# Patient Record
Sex: Male | Born: 1935 | Race: White | Hispanic: No | Marital: Single | Smoking: Former smoker
Health system: Southern US, Community
[De-identification: ages and names within clinical notes are randomized; demographics above are authoritative.]

## PROBLEM LIST (undated history)

## (undated) DIAGNOSIS — N400 Enlarged prostate without lower urinary tract symptoms: Secondary | ICD-10-CM

## (undated) DIAGNOSIS — I10 Essential (primary) hypertension: Secondary | ICD-10-CM

## (undated) DIAGNOSIS — C61 Malignant neoplasm of prostate: Secondary | ICD-10-CM

## (undated) DIAGNOSIS — I4891 Unspecified atrial fibrillation: Secondary | ICD-10-CM

## (undated) DIAGNOSIS — E785 Hyperlipidemia, unspecified: Secondary | ICD-10-CM

## (undated) HISTORY — DX: Unspecified atrial fibrillation: I48.91

## (undated) HISTORY — PX: HERNIA REPAIR: SHX51

---

## 1997-06-28 ENCOUNTER — Ambulatory Visit (HOSPITAL_BASED_OUTPATIENT_CLINIC_OR_DEPARTMENT_OTHER): Admission: RE | Admit: 1997-06-28 | Discharge: 1997-06-28 | Payer: Self-pay | Admitting: *Deleted

## 2001-02-18 ENCOUNTER — Ambulatory Visit: Admission: RE | Admit: 2001-02-18 | Discharge: 2001-05-19 | Payer: Self-pay | Admitting: Radiation Oncology

## 2001-02-19 ENCOUNTER — Other Ambulatory Visit: Admission: RE | Admit: 2001-02-19 | Discharge: 2001-02-19 | Payer: Self-pay | Admitting: Radiation Oncology

## 2001-03-08 ENCOUNTER — Encounter: Payer: Self-pay | Admitting: Urology

## 2001-03-08 ENCOUNTER — Encounter: Admission: RE | Admit: 2001-03-08 | Discharge: 2001-03-08 | Payer: Self-pay | Admitting: Urology

## 2001-04-14 ENCOUNTER — Ambulatory Visit (HOSPITAL_BASED_OUTPATIENT_CLINIC_OR_DEPARTMENT_OTHER): Admission: RE | Admit: 2001-04-14 | Discharge: 2001-04-14 | Payer: Self-pay | Admitting: Urology

## 2001-06-21 ENCOUNTER — Ambulatory Visit: Admission: RE | Admit: 2001-06-21 | Discharge: 2001-09-19 | Payer: Self-pay | Admitting: Radiation Oncology

## 2008-10-30 ENCOUNTER — Encounter: Admission: RE | Admit: 2008-10-30 | Discharge: 2008-10-30 | Payer: Self-pay | Admitting: Obstetrics and Gynecology

## 2008-10-31 ENCOUNTER — Ambulatory Visit (HOSPITAL_BASED_OUTPATIENT_CLINIC_OR_DEPARTMENT_OTHER): Admission: RE | Admit: 2008-10-31 | Discharge: 2008-10-31 | Payer: Self-pay | Admitting: Surgery

## 2010-03-25 IMAGING — CR DG CHEST 2V
2 series · 2 of 2 positions shown · non-contrast
Comparison: None

CLINICAL DATA: Preop for surgery for repair of inguinal hernia

CHEST - 2 VIEW

[w chest pa]
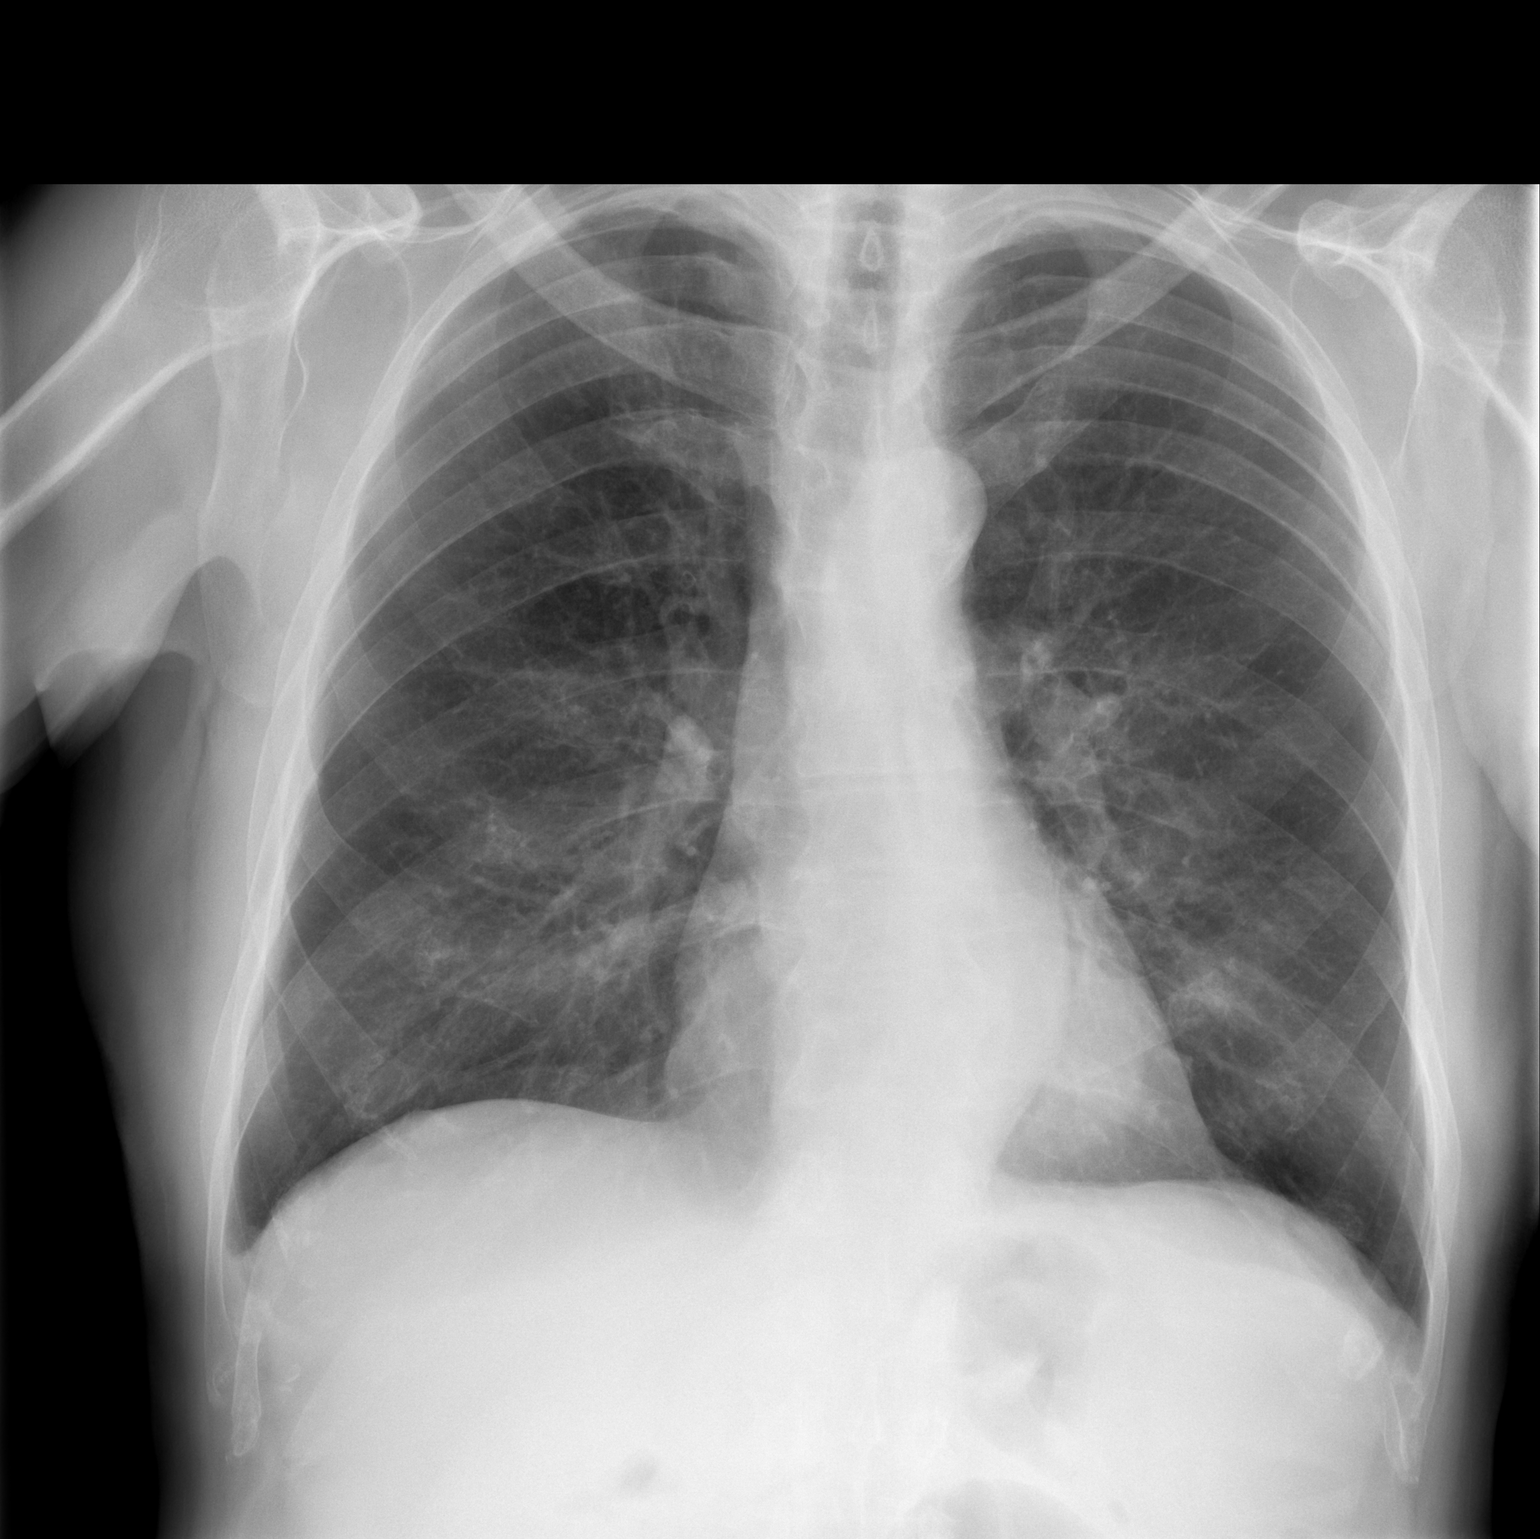

[w chest lat]
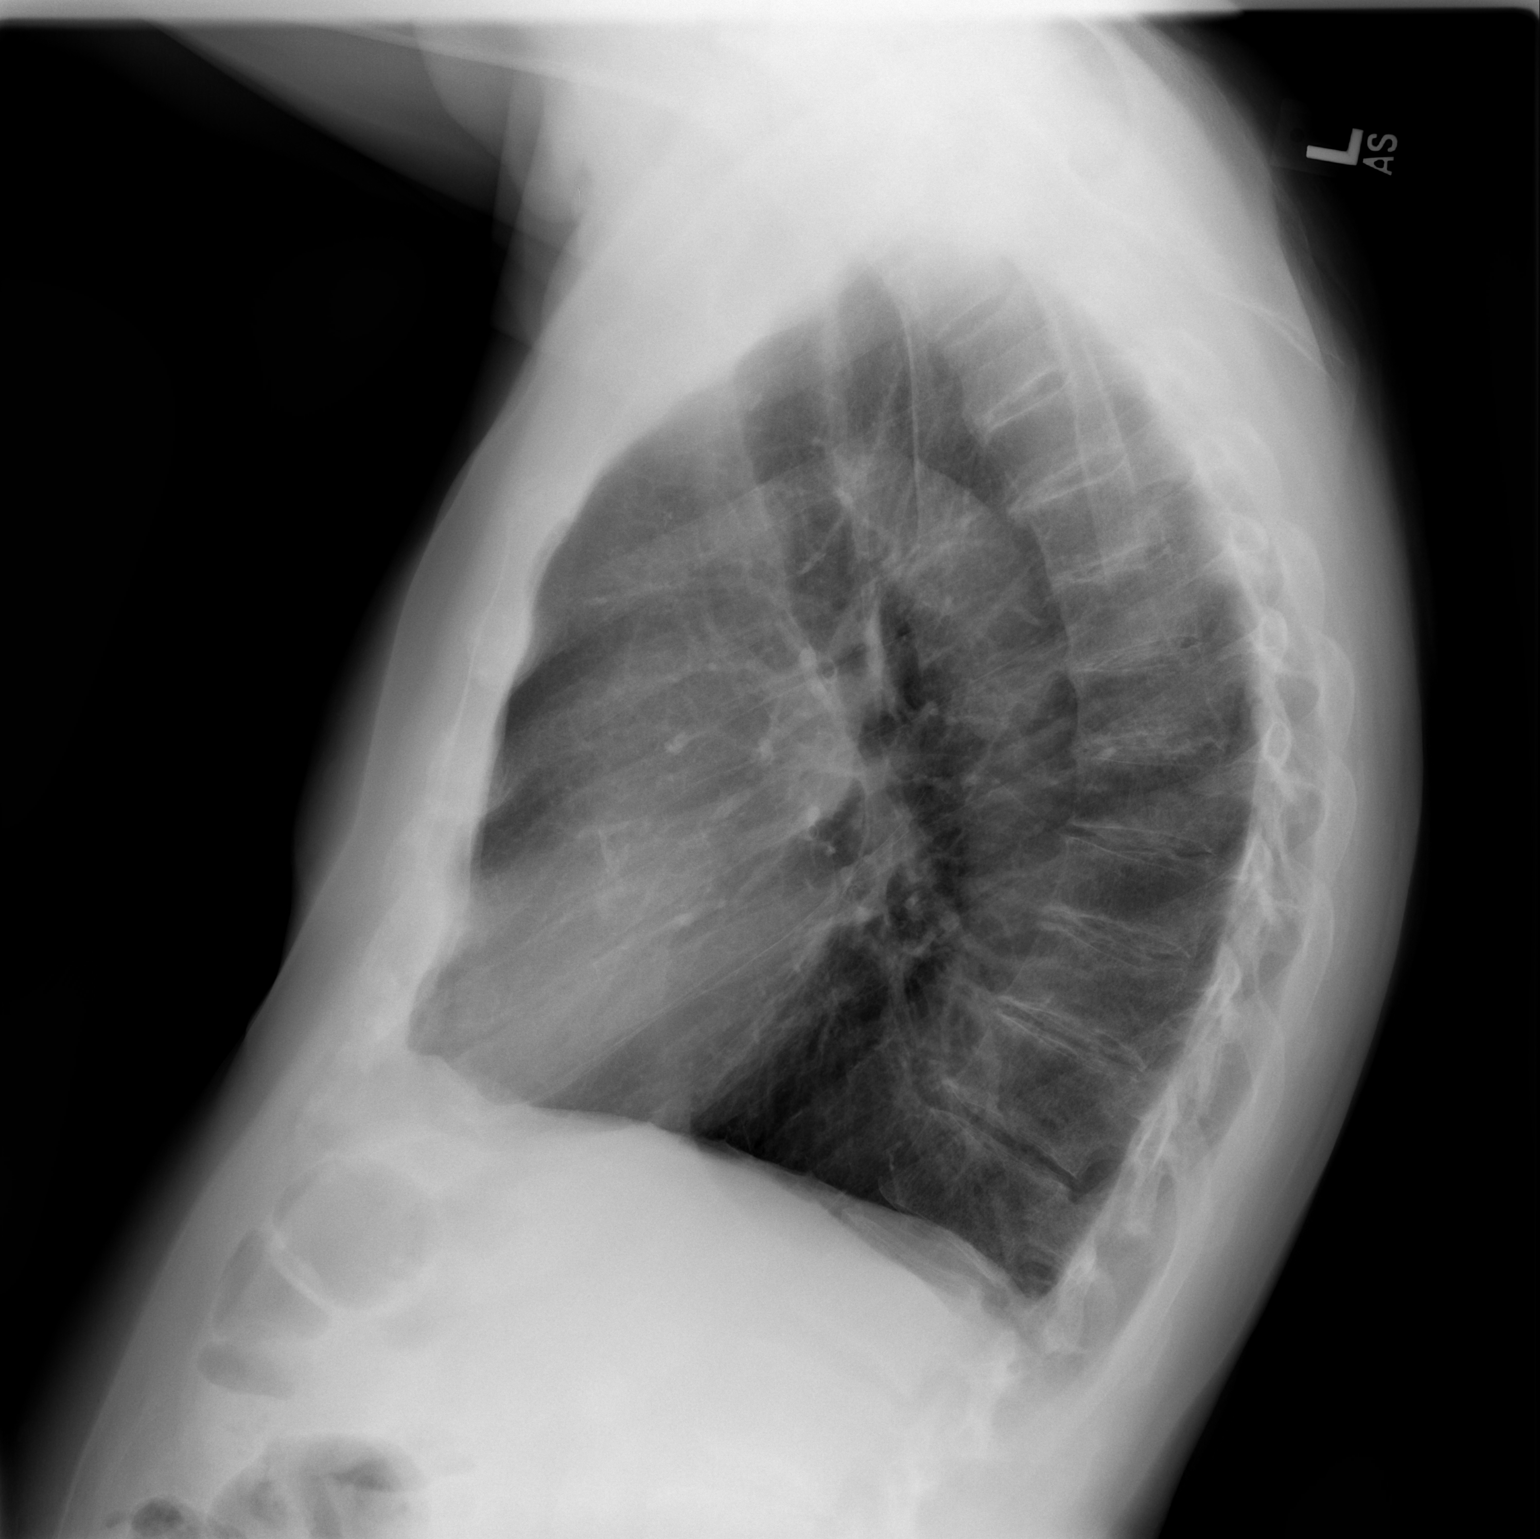

[2 of 2 positions shown; findings below may reference images not displayed]

FINDINGS: No active infiltrate or effusion is seen.  Mild
peribronchial thickening is noted.  The heart is within normal
limits in size.  No acute bony abnormality is seen.
IMPRESSION: No active lung disease.  Mild peribronchial thickening.

## 2010-05-24 LAB — BASIC METABOLIC PANEL
BUN: 7 mg/dL (ref 6–23)
CO2: 27 mEq/L (ref 19–32)
Calcium: 8.9 mg/dL (ref 8.4–10.5)
Chloride: 105 mEq/L (ref 96–112)
Creatinine, Ser: 0.85 mg/dL (ref 0.4–1.5)
GFR calc Af Amer: >60 mL/min (ref >60)
GFR calc non Af Amer: >60 mL/min (ref >60)
Glucose, Bld: 95 mg/dL (ref 70–99)
Potassium: 4.5 mEq/L (ref 3.5–5.1)
Sodium: 139 mEq/L (ref 135–145)

## 2010-05-24 LAB — POCT HEMOGLOBIN-HEMACUE: Hemoglobin: 16.2 g/dL (ref 13.0–17.0)

## 2010-07-05 NOTE — Op Note (Signed)
[facility]  Patient:    Gary Santiago, Gary Santiago Visit Number: [id number] MRN: [id number]          Service Type: NES Location: [facility] Attending Physician:  Thermon Leyland Dictated by:   Barron Alvine, M.D. Proc. Date: 04/14/01 Admit Date:  04/14/2001                             Operative Report  PREOPERATIVE DIAGNOSIS:  Clinical stage T1c adenocarcinoma of the prostate.  POSTOPERATIVE DIAGNOSIS:  Clinical stage T1c adenocarcinoma of the prostate.  PROCEDURE: 1. Implantation of Iodine 125 seeds. 2. Flexible cystoscopy.  SURGEON:  Barron Alvine, M.D.  ASSISTANT:  Maryln Gottron, M.D.  INDICATIONS:  Mr. Beigel is a 77 year old male. He has been diagnosed with clinical stage T1c adenocarcinoma of the prostate. He was diagnosed originally in the [place] area. He presented to [place] for a second opinion, initially requesting consideration for radical retropubic prostatectomy. We felt that given the situation, that may not be the best option. The patient did have a PSA that was elevated to approximately 16, although he had had approximately six biopsies done over the last several years, all of which have not shown carcinoma. He finally had one area on one biopsy that showed a small foci of carcinoma; that they felt that the Gleason score was 3+3=6. We felt that given his PSA of 16, it may not actually represent the true volume of his disease and his PSA elevation may be secondary to BPH and/or inflammation within his prostate. For that reason, along with his age, we felt that consideration for interstitial seed implantation as a monotherapy was a reasonable approach. We compared that with 3D conformal therapy, combined approaches with radiation as well as prostatectomy. Eventually, the patient did agree to seed implantation alone. Full informed consent was obtained.  TECHNIQUE AND FINDINGS:  The patient previously had planning  ultrasonography performed. He presented now for actual seed implantation. He was brought to the operating room where he had successful induction of general anesthesia. He was placed in the moderate lithotomy position and prepped and draped in the usual manner. A Foley catheter with contrast in the balloon was placed and a rectal tube was also placed. Real-time transrectal ultrasonography was used as was fluoroscopy. We took great care in positioning the patient and setting the prostate in the exact same position it was for the planning ultrasound. After we were satisfied with all of that, the patient had anchoring needles placed. We determined the reference plane for the seed implantation and again used the real-time ultrasonography as well as fluoroscopy. The seeds were then placed through 20 needles and a total of 94 seeds were implanted. These were all on strands. The patient had no obvious problems. Each one of these needle passes were done with both imaging modalities to confirm good positioning. At the end of the procedure, the distribution of the seeds on the ultrasound and fluoroscopy were excellent. There were no obvious stray seeds or strands. The Foley catheter was removed and cystoscopy revealed no evidence of seeds within the prostatic fossa or bladder. A repeat Foley catheter was placed and the patient brought to recovery room in stable condition. Dictated by:   Barron Alvine, M.D. Attending Physician:  Thermon Leyland DD:  04/14/01 TD:  04/14/01 Job: [id number]

## 2014-11-07 DIAGNOSIS — E780 Pure hypercholesterolemia: Secondary | ICD-10-CM | POA: Diagnosis not present

## 2014-11-07 DIAGNOSIS — M810 Age-related osteoporosis without current pathological fracture: Secondary | ICD-10-CM | POA: Diagnosis not present

## 2014-11-07 DIAGNOSIS — Z0000 Encounter for general adult medical examination without abnormal findings: Secondary | ICD-10-CM | POA: Diagnosis not present

## 2014-11-07 DIAGNOSIS — I10 Essential (primary) hypertension: Secondary | ICD-10-CM | POA: Diagnosis not present

## 2014-11-20 DIAGNOSIS — Z8546 Personal history of malignant neoplasm of prostate: Secondary | ICD-10-CM | POA: Diagnosis not present

## 2015-01-18 DIAGNOSIS — Z23 Encounter for immunization: Secondary | ICD-10-CM | POA: Diagnosis not present

## 2015-01-29 ENCOUNTER — Ambulatory Visit (INDEPENDENT_AMBULATORY_CARE_PROVIDER_SITE_OTHER): Payer: Medicare Other | Admitting: Family Medicine

## 2015-01-29 DIAGNOSIS — L723 Sebaceous cyst: Secondary | ICD-10-CM | POA: Diagnosis present

## 2015-01-29 NOTE — Progress Notes (Signed)
   HPI  CC: Right groin abscess Patient is here with complaints of a right groin abscess. He states that this growth was first recognized approximately 2 months ago while he was in the shower. Since that time and has grown substantially and has become tender to the touch. He noted earlier this week that it had begun draining some purulent fluid. He denies any history of hernias, abdominal pain, abdominal cramping, nausea, vomiting, diarrhea, lightheadedness, diaphoresis, testicular pain, or increased pain with coughing.   Patient is from [place]. He is currently visiting his sister and will be in town over the next week or 2.  Review of Systems   See HPI for ROS. All other systems reviewed and are negative.  Objective: There were no vitals taken for this visit. Gen: NAD, alert, cooperative, and pleasant. Well-appearing, elderly. Abd: SNTND, BS present, no guarding or organomegaly Integument: 3 x 3 cm erythematous nodule noted at the right inguinal fold. No evidence of tracking. Purulent discharge appreciated. Central ulceration noted. Some fluctuance noted. Surrounding induration. No tenderness or erythema extending beyond the site of this lesion. Ext: No edema, warm Neuro: Alert and oriented, Speech clear, No gross deficits   INCISION AND DRAINAGE OF right inguinal sebaceous cyst  A timeout protocol was performed prior to initiating the procedure. The area was prepared and draped in the usual, sterile manner. The site was anesthetized with 9 ml of 1 % lidocaine with epinephrine. A linear stab incision along the local skin lines was made and firm white malodorous material was expressed. The abcess was probed with hemostat to assess for additional pockets. Bleeding was minimal. 10 cm of packing was placed.   The patient tolerated the procedure well without complications.  Standard post-procedure care is explained and return precautions are given.   Assessment and plan:  Sebaceous  cyst Right inguinal sebaceous cyst was I&D'ed in the clinic today. Patient tolerated this well. Blood loss was minimal (<20ml). Patient was provided additional gauze. He was instructed to remove the packing in approximately 48-60 hours. He was asked to keep this area dry for at least 48 hours. A follow-up appointment was provided for Friday of this week. - Remove packing in 48-60 hours. - Keep dry and clean as best as possible. - No antibiotics warranted at this time. - We thoroughly discussed signs and symptoms of infection and indications to be seen in our office or urgent care sooner. - Follow-up on 12/16 at 3:15 PM for a wound check.    Elberta Leatherwood, MD,MS,  PGY2 01/29/2015 7:23 PM

## 2015-01-29 NOTE — Assessment & Plan Note (Signed)
Right inguinal sebaceous cyst was I&D'ed in the clinic today. Patient tolerated this well. Blood loss was minimal (<27ml). Patient was provided additional gauze. He was instructed to remove the packing in approximately 48-60 hours. He was asked to keep this area dry for at least 48 hours. A follow-up appointment was provided for Friday of this week. - Remove packing in 48-60 hours. - Keep dry and clean as best as possible. - No antibiotics warranted at this time. - We thoroughly discussed signs and symptoms of infection and indications to be seen in our office or urgent care sooner. - Follow-up on 12/16 at 3:15 PM for a wound check.

## 2015-01-29 NOTE — Patient Instructions (Signed)
Sebaceous Cyst Removal Sebaceous cyst removal is a procedure to remove a sac of oily material that forms under your skin (sebaceous cyst). Sebaceous cysts may also be called epidermoid cysts or keratin cysts. Normally, the skin secretes this oily material through a gland or a hair follicle. This type of cyst usually results when a skin gland or hair follicle becomes blocked. You may need this procedure if you have a sebaceous cyst that becomes large, uncomfortable, or infected. LET YOUR HEALTH CARE PROVIDER KNOW ABOUT:  Any allergies you have.  All medicines you are taking, including vitamins, herbs, eye drops, creams, and over-the-counter medicines.  Previous problems you or members of your family have had with the use of anesthetics.  Any blood disorders you have.  Previous surgeries you have had.  Medical conditions you have. RISKS AND COMPLICATIONS Generally, this is a safe procedure. However, problems may occur, including:  Developing another cyst.  Bleeding.  Infection.  Scarring. BEFORE THE PROCEDURE  Ask your health care provider about:  Changing or stopping your regular medicines. This is especially important if you are taking diabetes medicines or blood thinners.  Taking medicines such as aspirin and ibuprofen. These medicines can thin your blood. Do not take these medicines before your procedure if your health care provider instructs you not to.  If you have an infected cyst, you may have to take antibiotic medicines before or after the cyst removal. Take your antibiotics as directed by your health care provider. Finish all of the medicine even if you start to feel better.  Take a shower on the morning of your procedure. Your health care provider may ask you to use a germ-killing (antiseptic) soap. PROCEDURE  You will be given a medicine that numbs the area (local anesthetic).  The skin around the cyst will be cleaned with a germ-killing solution  (antiseptic).  Your health care provider will make a small surgical incision over the cyst.  The cyst will be separated from the surrounding tissues that are under your skin.  If possible, the cyst will be removed undamaged (intact).  If the cyst bursts (ruptures), it will need to be removed in pieces.  After the cyst is removed, your health care provider will control any bleeding and close the incision with small stitches (sutures). Small incisions may not need sutures, and the bleeding will be controlled by applying direct pressure with gauze.  Your health care provider may apply antibiotic ointment and a light bandage (dressing) over the incision. This procedure may vary among health care providers and hospitals. AFTER THE PROCEDURE  If your cyst ruptured during surgery, you may need to take antibiotic medicine. If you were prescribed an antibiotic medicine, finish all of it even if you start to feel better.   This information is not intended to replace advice given to you by your health care provider. Make sure you discuss any questions you have with your health care provider.   Document Released: 02/01/2000 Document Revised: 02/24/2014 Document Reviewed: 10/19/2013 Elsevier Interactive Patient Education 2016 Elsevier Inc.  

## 2015-02-02 ENCOUNTER — Ambulatory Visit (INDEPENDENT_AMBULATORY_CARE_PROVIDER_SITE_OTHER): Payer: Medicare Other | Admitting: Family Medicine

## 2015-02-02 VITALS — BP 135/54 | HR 58 | Temp 98.0°F | Wt 172.7 lb

## 2015-02-02 DIAGNOSIS — L0591 Pilonidal cyst without abscess: Secondary | ICD-10-CM | POA: Insufficient documentation

## 2015-02-02 DIAGNOSIS — L723 Sebaceous cyst: Secondary | ICD-10-CM

## 2015-02-02 NOTE — Patient Instructions (Signed)
I am referring you to a dermatology for your cyst on your backside. You will get a phone call to schedule this appointment.   For the spot in your groin: -keep it gently covered -warm compresses will help it keep draining -follow up in about 1 week for recheck -return sooner if worsening redness, drainage, fevers, pain etc  Be well, Dr. Ardelia Mems

## 2015-02-02 NOTE — Assessment & Plan Note (Signed)
Separate cyst from the one that was I&D'd by Dr. Alease Frame. Patient requesting referral for evaluation & removal of this cyst. Will refer to dermatology.

## 2015-02-02 NOTE — Assessment & Plan Note (Signed)
Packing removed today. Some redness around the area that seems superficial from skin irritation. Scrotum normal in size.  Recommend gently covering with gauze and follow up for check in 1 week, sooner if worsening. Given supply of gauze. Follow up 1 wk.

## 2015-02-02 NOTE — Progress Notes (Signed)
Date of Visit: 02/02/2015   HPI:  Groin cyst: seen on 12/12 for sebaceous cyst of R groin area that had grown in size. Underwent office I&D that day. Packed with 10cm of packing. Patient has not removed packing. The area feels fine, other than a little irritated from packing sticking out. Eating and drinking well. Stooling and urinating normally. No fevers.  Tailbone cyst: reports having cyst at top of gluteal cleft for many years that he would like removed. Bothers him when he sits for long periods. A few years ago he pulled a big clump of hair out of the area. Wants referral to have this done.   ROS: See HPI.  Gary Santiago: history of hyperlipidemia and atrial fibrillation  PHYSICAL EXAM: BP 135/54 mmHg  Pulse 58  Temp(Src) 98 F (36.7 C) (Oral)  Wt 172 lb 11.2 oz (78.336 kg) Gen: NAD, well appearing male, appears younger than stated age Skin: small incision site on R groin intertriginous area with packing still in place. Packing removed. Wound relatively superficial, with some surrounding induration but no fluctuance or tenderness. No expressible drainage. There is an area of superficial erythema appx 2cm away from the incision site which appears to be skin irritation rather than cellulitis/infection. Back: small palpable nodularity at top of gluteal cleft, peasized. No drainage. No hair tufts.  ASSESSMENT/PLAN:  Sebaceous cyst Packing removed today. Some redness around the area that seems superficial from skin irritation. Scrotum normal in size.  Recommend gently covering with gauze and follow up for check in 1 week, sooner if worsening. Given supply of gauze. Follow up 1 wk.   Cyst near tailbone Separate cyst from the one that was I&D'd by Dr. Alease Santiago. Patient requesting referral for evaluation & removal of this cyst. Will refer to dermatology.   FOLLOW UP: F/u in 1 week for groin cyst Referring to dermatology for tailbone cyst  Gary Santiago, [place]

## 2015-02-08 ENCOUNTER — Ambulatory Visit (INDEPENDENT_AMBULATORY_CARE_PROVIDER_SITE_OTHER): Payer: Medicare Other | Admitting: Family Medicine

## 2015-02-08 ENCOUNTER — Encounter: Payer: Self-pay | Admitting: Family Medicine

## 2015-02-08 VITALS — BP 133/59 | HR 65 | Temp 98.0°F | Ht 71.0 in | Wt 174.6 lb

## 2015-02-08 DIAGNOSIS — L723 Sebaceous cyst: Secondary | ICD-10-CM | POA: Diagnosis present

## 2015-02-08 DIAGNOSIS — I4891 Unspecified atrial fibrillation: Secondary | ICD-10-CM | POA: Insufficient documentation

## 2015-02-08 DIAGNOSIS — I482 Chronic atrial fibrillation, unspecified: Secondary | ICD-10-CM

## 2015-02-08 NOTE — Assessment & Plan Note (Signed)
Healing well and has stopped draining. Follow up as needed.

## 2015-02-08 NOTE — Patient Instructions (Signed)
Follow up as needed Be well, Dr. Ardelia Mems

## 2015-02-08 NOTE — Assessment & Plan Note (Signed)
Encouraged him to establish with new cardiologist once he has finished moving.

## 2015-02-08 NOTE — Progress Notes (Signed)
Date of Visit: 02/08/2015   HPI:  Patient presents for wound check of R inguinal sebaceous cyst. Had it removed/I&D'd on 12/12 by Dr. Alease Frame. Since I saw him on 12/16 it has done well. Drained every day except for yesterday, which is when it stopped draining. No pain. Urinating well. Eating and drinking well. No fevers.   Patient is in process of moving to [place] and plans to establish with a new PCP and cardiologist there. He has a history of atrial fibrillation that he reports has been well controlled for the last 25 years on diltiazem and baby aspirin.  ROS: See HPI.  [place]: history of atrial fibrillation, hyperlipidemia   PHYSICAL EXAM: BP 133/59 mmHg  Pulse 65  Temp(Src) 98 F (36.7 C) (Oral)  Ht 5\' 11"  (1.803 m)  Wt 174 lb 9.6 oz (79.198 kg)  BMI 24.36 kg/m2 Gen: NAD, pleasant cooperative HEENT: NCAT Lungs: normal work of breathing  Neuro: alert, gait and speech normal Skin: R inguinal area of prior I&D with mild induration and no fluctuance. No skin breakdown or drainage. nontender to palpation. Healing well.   ASSESSMENT/PLAN:  Atrial fibrillation [facility]) Encouraged him to establish with new cardiologist once he has finished moving.  Sebaceous cyst Healing well and has stopped draining. Follow up as needed.   FOLLOW UP: F/u as needed if symptoms worsen or do not improve.   [place]. Ardelia Mems, [place]

## 2015-03-16 DIAGNOSIS — L728 Other follicular cysts of the skin and subcutaneous tissue: Secondary | ICD-10-CM | POA: Diagnosis not present

## 2015-06-21 DIAGNOSIS — R300 Dysuria: Secondary | ICD-10-CM | POA: Diagnosis not present

## 2015-06-28 DIAGNOSIS — R300 Dysuria: Secondary | ICD-10-CM | POA: Diagnosis not present

## 2015-06-28 DIAGNOSIS — Z0000 Encounter for general adult medical examination without abnormal findings: Secondary | ICD-10-CM | POA: Diagnosis not present

## 2015-07-04 DIAGNOSIS — R300 Dysuria: Secondary | ICD-10-CM | POA: Diagnosis not present

## 2015-07-04 DIAGNOSIS — Z0000 Encounter for general adult medical examination without abnormal findings: Secondary | ICD-10-CM | POA: Diagnosis not present

## 2015-07-20 DIAGNOSIS — E785 Hyperlipidemia, unspecified: Secondary | ICD-10-CM | POA: Diagnosis not present

## 2015-07-20 DIAGNOSIS — R002 Palpitations: Secondary | ICD-10-CM | POA: Diagnosis not present

## 2015-07-20 DIAGNOSIS — M810 Age-related osteoporosis without current pathological fracture: Secondary | ICD-10-CM | POA: Diagnosis not present

## 2015-07-20 DIAGNOSIS — I10 Essential (primary) hypertension: Secondary | ICD-10-CM | POA: Diagnosis not present

## 2015-10-02 DIAGNOSIS — N390 Urinary tract infection, site not specified: Secondary | ICD-10-CM | POA: Diagnosis not present

## 2015-10-02 DIAGNOSIS — R300 Dysuria: Secondary | ICD-10-CM | POA: Diagnosis not present

## 2015-10-02 DIAGNOSIS — Z7982 Long term (current) use of aspirin: Secondary | ICD-10-CM | POA: Diagnosis not present

## 2015-10-02 DIAGNOSIS — I4891 Unspecified atrial fibrillation: Secondary | ICD-10-CM | POA: Diagnosis not present

## 2015-10-29 DIAGNOSIS — N390 Urinary tract infection, site not specified: Secondary | ICD-10-CM | POA: Diagnosis not present

## 2015-10-29 DIAGNOSIS — Z7982 Long term (current) use of aspirin: Secondary | ICD-10-CM | POA: Diagnosis not present

## 2015-11-08 DIAGNOSIS — R3121 Asymptomatic microscopic hematuria: Secondary | ICD-10-CM | POA: Diagnosis not present

## 2015-11-08 DIAGNOSIS — N3020 Other chronic cystitis without hematuria: Secondary | ICD-10-CM | POA: Diagnosis not present

## 2015-11-09 DIAGNOSIS — R319 Hematuria, unspecified: Secondary | ICD-10-CM | POA: Diagnosis not present

## 2015-11-22 DIAGNOSIS — Z23 Encounter for immunization: Secondary | ICD-10-CM | POA: Diagnosis not present

## 2015-12-04 DIAGNOSIS — Z7982 Long term (current) use of aspirin: Secondary | ICD-10-CM | POA: Diagnosis not present

## 2015-12-04 DIAGNOSIS — N390 Urinary tract infection, site not specified: Secondary | ICD-10-CM | POA: Diagnosis not present

## 2015-12-10 DIAGNOSIS — R300 Dysuria: Secondary | ICD-10-CM | POA: Diagnosis not present

## 2015-12-10 DIAGNOSIS — Z8546 Personal history of malignant neoplasm of prostate: Secondary | ICD-10-CM | POA: Diagnosis not present

## 2015-12-10 DIAGNOSIS — R351 Nocturia: Secondary | ICD-10-CM | POA: Diagnosis not present

## 2015-12-10 DIAGNOSIS — N3020 Other chronic cystitis without hematuria: Secondary | ICD-10-CM | POA: Diagnosis not present

## 2015-12-10 DIAGNOSIS — R3915 Urgency of urination: Secondary | ICD-10-CM | POA: Diagnosis not present

## 2016-01-01 DIAGNOSIS — E7800 Pure hypercholesterolemia, unspecified: Secondary | ICD-10-CM | POA: Diagnosis not present

## 2016-01-01 DIAGNOSIS — N390 Urinary tract infection, site not specified: Secondary | ICD-10-CM | POA: Diagnosis not present

## 2016-01-01 DIAGNOSIS — Z87440 Personal history of urinary (tract) infections: Secondary | ICD-10-CM | POA: Diagnosis not present

## 2016-01-01 DIAGNOSIS — I4891 Unspecified atrial fibrillation: Secondary | ICD-10-CM | POA: Diagnosis not present

## 2016-01-01 DIAGNOSIS — Z7982 Long term (current) use of aspirin: Secondary | ICD-10-CM | POA: Diagnosis not present

## 2016-01-17 DIAGNOSIS — R350 Frequency of micturition: Secondary | ICD-10-CM | POA: Diagnosis not present

## 2016-01-17 DIAGNOSIS — R300 Dysuria: Secondary | ICD-10-CM | POA: Diagnosis not present

## 2016-01-17 DIAGNOSIS — N390 Urinary tract infection, site not specified: Secondary | ICD-10-CM | POA: Diagnosis not present

## 2016-03-03 DIAGNOSIS — C61 Malignant neoplasm of prostate: Secondary | ICD-10-CM | POA: Diagnosis not present

## 2016-03-03 DIAGNOSIS — R300 Dysuria: Secondary | ICD-10-CM | POA: Diagnosis not present

## 2016-03-27 DIAGNOSIS — Z719 Counseling, unspecified: Secondary | ICD-10-CM | POA: Diagnosis not present

## 2016-03-27 DIAGNOSIS — Z139 Encounter for screening, unspecified: Secondary | ICD-10-CM | POA: Diagnosis not present

## 2016-03-27 DIAGNOSIS — J40 Bronchitis, not specified as acute or chronic: Secondary | ICD-10-CM | POA: Diagnosis not present

## 2016-04-04 ENCOUNTER — Telehealth: Payer: Self-pay | Admitting: Family Medicine

## 2016-04-04 NOTE — Telephone Encounter (Signed)
Chart review mentions that patient planned on moving but no further follow-up had been mad to establish this. The patient has confirmed that he has settled in [place] and is no longer an [facility] patient.

## 2016-04-08 DIAGNOSIS — E785 Hyperlipidemia, unspecified: Secondary | ICD-10-CM | POA: Diagnosis not present

## 2016-04-08 DIAGNOSIS — N400 Enlarged prostate without lower urinary tract symptoms: Secondary | ICD-10-CM | POA: Diagnosis not present

## 2016-04-08 DIAGNOSIS — R7301 Impaired fasting glucose: Secondary | ICD-10-CM | POA: Diagnosis not present

## 2016-04-08 DIAGNOSIS — Z136 Encounter for screening for cardiovascular disorders: Secondary | ICD-10-CM | POA: Diagnosis not present

## 2016-04-08 DIAGNOSIS — I482 Chronic atrial fibrillation: Secondary | ICD-10-CM | POA: Diagnosis not present

## 2016-04-08 DIAGNOSIS — I10 Essential (primary) hypertension: Secondary | ICD-10-CM | POA: Diagnosis not present

## 2016-04-08 DIAGNOSIS — E559 Vitamin D deficiency, unspecified: Secondary | ICD-10-CM | POA: Diagnosis not present

## 2016-04-08 DIAGNOSIS — Z719 Counseling, unspecified: Secondary | ICD-10-CM | POA: Diagnosis not present

## 2016-04-08 DIAGNOSIS — Z1211 Encounter for screening for malignant neoplasm of colon: Secondary | ICD-10-CM | POA: Diagnosis not present

## 2016-06-21 DIAGNOSIS — Z719 Counseling, unspecified: Secondary | ICD-10-CM | POA: Diagnosis not present

## 2016-06-21 DIAGNOSIS — W57XXXA Bitten or stung by nonvenomous insect and other nonvenomous arthropods, initial encounter: Secondary | ICD-10-CM | POA: Diagnosis not present

## 2016-12-18 DIAGNOSIS — E785 Hyperlipidemia, unspecified: Secondary | ICD-10-CM | POA: Diagnosis not present

## 2016-12-18 DIAGNOSIS — I482 Chronic atrial fibrillation: Secondary | ICD-10-CM | POA: Diagnosis not present

## 2016-12-18 DIAGNOSIS — I10 Essential (primary) hypertension: Secondary | ICD-10-CM | POA: Diagnosis not present

## 2016-12-18 DIAGNOSIS — E559 Vitamin D deficiency, unspecified: Secondary | ICD-10-CM | POA: Diagnosis not present

## 2016-12-23 DIAGNOSIS — I482 Chronic atrial fibrillation: Secondary | ICD-10-CM | POA: Diagnosis not present

## 2017-01-13 DIAGNOSIS — I4891 Unspecified atrial fibrillation: Secondary | ICD-10-CM | POA: Diagnosis not present

## 2017-02-06 DIAGNOSIS — Z23 Encounter for immunization: Secondary | ICD-10-CM | POA: Diagnosis not present

## 2017-04-30 DIAGNOSIS — L853 Xerosis cutis: Secondary | ICD-10-CM | POA: Diagnosis not present

## 2017-04-30 DIAGNOSIS — L570 Actinic keratosis: Secondary | ICD-10-CM | POA: Diagnosis not present

## 2017-04-30 DIAGNOSIS — L821 Other seborrheic keratosis: Secondary | ICD-10-CM | POA: Diagnosis not present

## 2017-04-30 DIAGNOSIS — L818 Other specified disorders of pigmentation: Secondary | ICD-10-CM | POA: Diagnosis not present

## 2017-04-30 DIAGNOSIS — C44329 Squamous cell carcinoma of skin of other parts of face: Secondary | ICD-10-CM | POA: Diagnosis not present

## 2017-04-30 DIAGNOSIS — D2339 Other benign neoplasm of skin of other parts of face: Secondary | ICD-10-CM | POA: Diagnosis not present

## 2017-04-30 DIAGNOSIS — L578 Other skin changes due to chronic exposure to nonionizing radiation: Secondary | ICD-10-CM | POA: Diagnosis not present

## 2017-04-30 DIAGNOSIS — D485 Neoplasm of uncertain behavior of skin: Secondary | ICD-10-CM | POA: Diagnosis not present

## 2017-05-08 DIAGNOSIS — R9721 Rising PSA following treatment for malignant neoplasm of prostate: Secondary | ICD-10-CM | POA: Diagnosis not present

## 2017-05-08 DIAGNOSIS — R300 Dysuria: Secondary | ICD-10-CM | POA: Diagnosis not present

## 2017-05-08 DIAGNOSIS — C61 Malignant neoplasm of prostate: Secondary | ICD-10-CM | POA: Diagnosis not present

## 2017-05-28 DIAGNOSIS — D1801 Hemangioma of skin and subcutaneous tissue: Secondary | ICD-10-CM | POA: Diagnosis not present

## 2017-05-28 DIAGNOSIS — D0460 Carcinoma in situ of skin of unspecified upper limb, including shoulder: Secondary | ICD-10-CM | POA: Diagnosis not present

## 2017-05-28 DIAGNOSIS — C44329 Squamous cell carcinoma of skin of other parts of face: Secondary | ICD-10-CM | POA: Diagnosis not present

## 2017-05-28 DIAGNOSIS — L570 Actinic keratosis: Secondary | ICD-10-CM | POA: Diagnosis not present

## 2017-05-28 DIAGNOSIS — L818 Other specified disorders of pigmentation: Secondary | ICD-10-CM | POA: Diagnosis not present

## 2017-05-28 DIAGNOSIS — D485 Neoplasm of uncertain behavior of skin: Secondary | ICD-10-CM | POA: Diagnosis not present

## 2017-05-28 DIAGNOSIS — L578 Other skin changes due to chronic exposure to nonionizing radiation: Secondary | ICD-10-CM | POA: Diagnosis not present

## 2017-05-28 DIAGNOSIS — L821 Other seborrheic keratosis: Secondary | ICD-10-CM | POA: Diagnosis not present

## 2017-05-28 DIAGNOSIS — Z1283 Encounter for screening for malignant neoplasm of skin: Secondary | ICD-10-CM | POA: Diagnosis not present

## 2017-06-08 DIAGNOSIS — C44329 Squamous cell carcinoma of skin of other parts of face: Secondary | ICD-10-CM | POA: Diagnosis not present

## 2017-07-07 DIAGNOSIS — D0461 Carcinoma in situ of skin of right upper limb, including shoulder: Secondary | ICD-10-CM | POA: Diagnosis not present

## 2017-07-07 DIAGNOSIS — L570 Actinic keratosis: Secondary | ICD-10-CM | POA: Diagnosis not present

## 2017-07-07 DIAGNOSIS — D485 Neoplasm of uncertain behavior of skin: Secondary | ICD-10-CM | POA: Diagnosis not present

## 2017-08-31 DIAGNOSIS — L308 Other specified dermatitis: Secondary | ICD-10-CM | POA: Diagnosis not present

## 2017-08-31 DIAGNOSIS — D0461 Carcinoma in situ of skin of right upper limb, including shoulder: Secondary | ICD-10-CM | POA: Diagnosis not present

## 2017-08-31 DIAGNOSIS — L570 Actinic keratosis: Secondary | ICD-10-CM | POA: Diagnosis not present

## 2017-08-31 DIAGNOSIS — L298 Other pruritus: Secondary | ICD-10-CM | POA: Diagnosis not present

## 2017-08-31 DIAGNOSIS — Z85828 Personal history of other malignant neoplasm of skin: Secondary | ICD-10-CM | POA: Diagnosis not present

## 2017-09-24 DIAGNOSIS — T148XXD Other injury of unspecified body region, subsequent encounter: Secondary | ICD-10-CM | POA: Diagnosis not present

## 2017-09-24 DIAGNOSIS — N400 Enlarged prostate without lower urinary tract symptoms: Secondary | ICD-10-CM | POA: Diagnosis not present

## 2017-09-24 DIAGNOSIS — I10 Essential (primary) hypertension: Secondary | ICD-10-CM | POA: Diagnosis not present

## 2017-09-24 DIAGNOSIS — R600 Localized edema: Secondary | ICD-10-CM | POA: Diagnosis not present

## 2017-09-24 DIAGNOSIS — I482 Chronic atrial fibrillation: Secondary | ICD-10-CM | POA: Diagnosis not present

## 2017-11-02 DIAGNOSIS — Z08 Encounter for follow-up examination after completed treatment for malignant neoplasm: Secondary | ICD-10-CM | POA: Diagnosis not present

## 2017-11-02 DIAGNOSIS — C44622 Squamous cell carcinoma of skin of right upper limb, including shoulder: Secondary | ICD-10-CM | POA: Diagnosis not present

## 2017-11-02 DIAGNOSIS — L853 Xerosis cutis: Secondary | ICD-10-CM | POA: Diagnosis not present

## 2017-11-02 DIAGNOSIS — L570 Actinic keratosis: Secondary | ICD-10-CM | POA: Diagnosis not present

## 2017-11-02 DIAGNOSIS — Z85828 Personal history of other malignant neoplasm of skin: Secondary | ICD-10-CM | POA: Diagnosis not present

## 2017-12-15 DIAGNOSIS — C44622 Squamous cell carcinoma of skin of right upper limb, including shoulder: Secondary | ICD-10-CM | POA: Diagnosis not present

## 2017-12-15 DIAGNOSIS — L570 Actinic keratosis: Secondary | ICD-10-CM | POA: Diagnosis not present

## 2017-12-30 DIAGNOSIS — L570 Actinic keratosis: Secondary | ICD-10-CM | POA: Diagnosis not present

## 2018-01-05 DIAGNOSIS — Z23 Encounter for immunization: Secondary | ICD-10-CM | POA: Diagnosis not present

## 2018-01-05 DIAGNOSIS — E785 Hyperlipidemia, unspecified: Secondary | ICD-10-CM | POA: Diagnosis not present

## 2018-01-05 DIAGNOSIS — N400 Enlarged prostate without lower urinary tract symptoms: Secondary | ICD-10-CM | POA: Diagnosis not present

## 2018-01-05 DIAGNOSIS — I10 Essential (primary) hypertension: Secondary | ICD-10-CM | POA: Diagnosis not present

## 2018-01-05 DIAGNOSIS — E559 Vitamin D deficiency, unspecified: Secondary | ICD-10-CM | POA: Diagnosis not present

## 2018-01-05 DIAGNOSIS — I482 Chronic atrial fibrillation, unspecified: Secondary | ICD-10-CM | POA: Diagnosis not present

## 2021-09-08 ENCOUNTER — Other Ambulatory Visit: Payer: Self-pay

## 2021-09-08 ENCOUNTER — Emergency Department (HOSPITAL_COMMUNITY): Payer: Medicare Other

## 2021-09-08 ENCOUNTER — Inpatient Hospital Stay (HOSPITAL_COMMUNITY)
Admission: EM | Admit: 2021-09-08 | Discharge: 2021-09-11 | DRG: 871 | Disposition: A | Discharge status: Skilled Nursing Facility | Payer: Medicare Other | Source: Skilled Nursing Facility | Attending: Internal Medicine | Admitting: Internal Medicine

## 2021-09-08 DIAGNOSIS — Z87891 Personal history of nicotine dependence: Secondary | ICD-10-CM

## 2021-09-08 DIAGNOSIS — L89312 Pressure ulcer of right buttock, stage 2: Secondary | ICD-10-CM | POA: Diagnosis present

## 2021-09-08 DIAGNOSIS — U071 COVID-19: Secondary | ICD-10-CM | POA: Diagnosis present

## 2021-09-08 DIAGNOSIS — R6520 Severe sepsis without septic shock: Secondary | ICD-10-CM

## 2021-09-08 DIAGNOSIS — I248 Other forms of acute ischemic heart disease: Secondary | ICD-10-CM | POA: Diagnosis present

## 2021-09-08 DIAGNOSIS — N400 Enlarged prostate without lower urinary tract symptoms: Secondary | ICD-10-CM | POA: Diagnosis present

## 2021-09-08 DIAGNOSIS — A4189 Other specified sepsis: Principal | ICD-10-CM | POA: Diagnosis present

## 2021-09-08 DIAGNOSIS — H9190 Unspecified hearing loss, unspecified ear: Secondary | ICD-10-CM | POA: Diagnosis present

## 2021-09-08 DIAGNOSIS — Z7982 Long term (current) use of aspirin: Secondary | ICD-10-CM

## 2021-09-08 DIAGNOSIS — R051 Acute cough: Secondary | ICD-10-CM

## 2021-09-08 DIAGNOSIS — I4891 Unspecified atrial fibrillation: Secondary | ICD-10-CM | POA: Diagnosis present

## 2021-09-08 DIAGNOSIS — Z79899 Other long term (current) drug therapy: Secondary | ICD-10-CM

## 2021-09-08 DIAGNOSIS — E785 Hyperlipidemia, unspecified: Secondary | ICD-10-CM | POA: Diagnosis present

## 2021-09-08 DIAGNOSIS — R509 Fever, unspecified: Secondary | ICD-10-CM

## 2021-09-08 DIAGNOSIS — R410 Disorientation, unspecified: Principal | ICD-10-CM

## 2021-09-08 DIAGNOSIS — I4821 Permanent atrial fibrillation: Secondary | ICD-10-CM | POA: Diagnosis present

## 2021-09-08 DIAGNOSIS — A419 Sepsis, unspecified organism: Secondary | ICD-10-CM | POA: Diagnosis present

## 2021-09-08 DIAGNOSIS — J1282 Pneumonia due to coronavirus disease 2019: Secondary | ICD-10-CM | POA: Diagnosis present

## 2021-09-08 DIAGNOSIS — L8990 Pressure ulcer of unspecified site, unspecified stage: Secondary | ICD-10-CM | POA: Insufficient documentation

## 2021-09-08 DIAGNOSIS — G929 Unspecified toxic encephalopathy: Secondary | ICD-10-CM | POA: Diagnosis present

## 2021-09-08 DIAGNOSIS — D6959 Other secondary thrombocytopenia: Secondary | ICD-10-CM | POA: Diagnosis present

## 2021-09-08 DIAGNOSIS — Z8546 Personal history of malignant neoplasm of prostate: Secondary | ICD-10-CM

## 2021-09-08 DIAGNOSIS — I10 Essential (primary) hypertension: Secondary | ICD-10-CM | POA: Diagnosis present

## 2021-09-08 HISTORY — DX: Hyperlipidemia, unspecified: E78.5

## 2021-09-08 HISTORY — DX: Essential (primary) hypertension: I10

## 2021-09-08 HISTORY — DX: Malignant neoplasm of prostate: C61

## 2021-09-08 HISTORY — DX: Benign prostatic hyperplasia without lower urinary tract symptoms: N40.0

## 2021-09-08 LAB — COMPREHENSIVE METABOLIC PANEL
ALT: 25 U/L (ref 0–44)
AST: 29 U/L (ref 15–41)
Albumin: 4 g/dL (ref 3.5–5.0)
Alkaline Phosphatase: 75 U/L (ref 38–126)
Anion gap: 11 (ref 5–15)
BUN: 20 mg/dL (ref 8–23)
CO2: 24 mmol/L (ref 22–32)
Calcium: 9.1 mg/dL (ref 8.9–10.3)
Chloride: 100 mmol/L (ref 98–111)
Creatinine, Ser: 1.19 mg/dL (ref 0.61–1.24)
GFR, Estimated: 57 mL/min — ABNORMAL LOW (ref >60)
Glucose, Bld: 168 mg/dL — ABNORMAL HIGH (ref 70–99)
Potassium: 3.9 mmol/L (ref 3.5–5.1)
Sodium: 135 mmol/L (ref 135–145)
Total Bilirubin: 1.9 mg/dL — ABNORMAL HIGH (ref 0.3–1.2)
Total Protein: 7.3 g/dL (ref 6.5–8.1)

## 2021-09-08 LAB — CBC WITH DIFFERENTIAL/PLATELET
Abs Immature Granulocytes: 0.04 K/uL (ref 0.00–0.07)
Basophils Absolute: 0 K/uL (ref 0.0–0.1)
Basophils Relative: 0 %
Eosinophils Absolute: 0 K/uL (ref 0.0–0.5)
Eosinophils Relative: 0 %
HCT: 44.2 % (ref 39.0–52.0)
Hemoglobin: 14.9 g/dL (ref 13.0–17.0)
Immature Granulocytes: 1 %
Lymphocytes Relative: 6 %
Lymphs Abs: 0.6 K/uL — ABNORMAL LOW (ref 0.7–4.0)
MCH: 30.8 pg (ref 26.0–34.0)
MCHC: 33.7 g/dL (ref 30.0–36.0)
MCV: 91.3 fL (ref 80.0–100.0)
Monocytes Absolute: 0.5 K/uL (ref 0.1–1.0)
Monocytes Relative: 6 %
Neutro Abs: 7.7 K/uL (ref 1.7–7.7)
Neutrophils Relative %: 87 %
Platelets: 144 K/uL — ABNORMAL LOW (ref 150–400)
RBC: 4.84 MIL/uL (ref 4.22–5.81)
RDW: 13.5 % (ref 11.5–15.5)
WBC: 8.8 K/uL (ref 4.0–10.5)
nRBC: 0 % (ref 0.0–0.2)

## 2021-09-08 LAB — LACTIC ACID, PLASMA: Lactic Acid, Venous: 3.2 mmol/L (ref 0.5–1.9)

## 2021-09-08 LAB — CBG MONITORING, ED: Glucose-Capillary: 125 mg/dL — ABNORMAL HIGH (ref 70–99)

## 2021-09-08 LAB — TROPONIN I (HIGH SENSITIVITY): Troponin I (High Sensitivity): 19 ng/L — ABNORMAL HIGH (ref <18)

## 2021-09-08 MED ORDER — SODIUM CHLORIDE 0.9 % IV SOLN
2.0000 g | INTRAVENOUS | Status: DC
  Administered 2021-09-09: 2 g via INTRAVENOUS
  Filled 2021-09-08: qty 20

## 2021-09-08 MED ORDER — SODIUM CHLORIDE 0.9 % IV BOLUS
1000.0000 mL | Freq: Once | INTRAVENOUS | Status: AC
  Administered 2021-09-08: 1000 mL via INTRAVENOUS

## 2021-09-08 MED ORDER — ACETAMINOPHEN 325 MG PO TABS
650.0000 mg | ORAL_TABLET | Freq: Once | ORAL | Status: AC
  Administered 2021-09-08: 650 mg via ORAL
  Filled 2021-09-08: qty 2

## 2021-09-08 MED ORDER — ONDANSETRON HCL 4 MG PO TABS: 4.0000 mg | ORAL_TABLET | Freq: Four times a day (QID) | ORAL | Status: DC | PRN

## 2021-09-08 MED ORDER — ONDANSETRON HCL 4 MG/2ML IJ SOLN: 4.0000 mg | Freq: Four times a day (QID) | INTRAMUSCULAR | Status: DC | PRN

## 2021-09-08 MED ORDER — LACTATED RINGERS IV SOLN: INTRAVENOUS | Status: DC

## 2021-09-08 MED ORDER — ACETAMINOPHEN 650 MG RE SUPP: 650.0000 mg | Freq: Four times a day (QID) | RECTAL | Status: DC | PRN

## 2021-09-08 MED ORDER — PIPERACILLIN-TAZOBACTAM 3.375 G IVPB 30 MIN
3.3750 g | Freq: Once | INTRAVENOUS | Status: AC
  Administered 2021-09-08: 3.375 g via INTRAVENOUS
  Filled 2021-09-08: qty 50

## 2021-09-08 MED ORDER — ACETAMINOPHEN 325 MG PO TABS: 650.0000 mg | ORAL_TABLET | Freq: Four times a day (QID) | ORAL | Status: DC | PRN

## 2021-09-08 MED ORDER — SODIUM CHLORIDE 0.9 % IV SOLN
500.0000 mg | INTRAVENOUS | Status: DC
  Administered 2021-09-09: 500 mg via INTRAVENOUS
  Filled 2021-09-08: qty 5

## 2021-09-08 MED ORDER — ENOXAPARIN SODIUM 40 MG/0.4ML IJ SOSY
40.0000 mg | PREFILLED_SYRINGE | INTRAMUSCULAR | Status: DC
  Administered 2021-09-09 – 2021-09-11 (×3): 40 mg via SUBCUTANEOUS
  Filled 2021-09-08 (×3): qty 0.4

## 2021-09-08 NOTE — Assessment & Plan Note (Signed)
Hold home BP meds in setting of sepsis. 

## 2021-09-08 NOTE — Assessment & Plan Note (Signed)
Hold home cardizem Use short acting if needed Tele monitor Doesn't appear to be on any [facility]

## 2021-09-08 NOTE — Assessment & Plan Note (Addendum)
Patient meets criteria for sepsis at time of admission. Specifically the patient has at least 2 out of 4 SIRS criteria, namely: fever and tachypnea The currently suspected source of infection is Unknown source, possibly PNA based on cough, low O2 sat. Lactic acid: 3.4 Blood pressure: 129/59 IVF: 1L bolus in ED and LR at 100 Antibiotics: zosyn Cultures pending 1. Check procalcitonin 2. UA pending 3. Covid pending 4. Tele monitor 5. Got zosyn in ED 6. Will put on rocephin + azithromycin for presumed CAP coverage for the moment.

## 2021-09-08 NOTE — ED Provider Notes (Addendum)
[facility] DEPT Provider Note   CSN: [id number] Arrival date & time: 09/08/21  1926     History  Chief Complaint  Patient presents with   Code Sepsis    Gary Santiago is a 88 y.o. male.  Patient brought in by EMS from nursing home chief complaint of confusion, cough and fevers ongoing for 2 days.  Patient otherwise denies any headache or chest pain or abdominal pain.  No reports of vomiting or diarrhea.       Home Medications Prior to Admission medications   Medication Sig Start Date End Date Taking? Authorizing Provider  aspirin 81 MG tablet Take 81 mg by mouth daily.   Yes [provider]  Cholecalciferol (VITAMIN D-3 PO) Take 1,000 Units by mouth daily.   Yes [provider]  diltiazem (CARDIZEM CD) 240 MG 24 hr capsule Take 240 mg by mouth daily. 08/29/21  Yes [provider]  hydrochlorothiazide (HYDRODIURIL) 12.5 MG tablet Take 12.5 mg by mouth daily. 08/29/21  Yes [provider]  Multiple Vitamin (MULTIVITAMIN) tablet Take 1 tablet by mouth daily.   Yes [provider]  simvastatin (ZOCOR) 20 MG tablet Take 20 mg by mouth daily. 08/29/21  Yes [provider]  tamsulosin (FLOMAX) 0.4 MG CAPS capsule Take 0.4 mg by mouth at bedtime. 08/29/21  Yes [provider]      Allergies    Patient has no known allergies.    Review of Systems   Review of Systems  Constitutional:  Positive for fever.  HENT:  Negative for ear pain and sore throat.   Eyes:  Negative for pain.  Respiratory:  Positive for cough.   Cardiovascular:  Negative for chest pain.  Gastrointestinal:  Negative for abdominal pain.  Genitourinary:  Negative for flank pain.  Musculoskeletal:  Negative for back pain.  Skin:  Negative for color change and rash.  Neurological:  Negative for syncope.  All other systems reviewed and are negative.   Physical Exam Updated Vital Signs BP (!) 132/57   Pulse 75    Temp (!) 101.6 F (38.7 C) (Rectal)   Resp (!) 23   SpO2 91%  Physical Exam Constitutional:      Appearance: He is well-developed.  HENT:     Head: Normocephalic.     Nose: Nose normal.  Eyes:     Extraocular Movements: Extraocular movements intact.  Cardiovascular:     Rate and Rhythm: Tachycardia present.  Pulmonary:     Effort: Pulmonary effort is normal.     Breath sounds: Rales present.  Abdominal:     General: There is no distension.     Tenderness: There is no abdominal tenderness. There is no guarding.  Skin:    Coloration: Skin is not jaundiced.  Neurological:     Mental Status: He is alert.     Comments: Patient awake and alert, verbal and follows commands, appears to be moving all extremities.     ED Results / Procedures / Treatments   Labs (all labs ordered are listed, but only abnormal results are displayed) Labs Reviewed  CBC WITH DIFFERENTIAL/PLATELET - Abnormal; Notable for the following components:      Result Value   Platelets 144 (*)    Lymphs Abs 0.6 (*)    All other components within normal limits  COMPREHENSIVE METABOLIC PANEL - Abnormal; Notable for the following components:   Glucose, Bld 168 (*)    Total Bilirubin 1.9 (*)  GFR, Estimated 57 (*)    All other components within normal limits  LACTIC ACID, PLASMA - Abnormal; Notable for the following components:   Lactic Acid, Venous 3.2 (*)    All other components within normal limits  CBG MONITORING, ED - Abnormal; Notable for the following components:   Glucose-Capillary 125 (*)    All other components within normal limits  TROPONIN I (HIGH SENSITIVITY) - Abnormal; Notable for the following components:   Troponin I (High Sensitivity) 19 (*)    All other components within normal limits  CULTURE, BLOOD (ROUTINE X 2)  CULTURE, BLOOD (ROUTINE X 2)  URINE CULTURE  RESP PANEL BY RT-PCR (RSV, FLU A&B, COVID)  [id number]  LACTIC ACID, PLASMA  URINALYSIS, ROUTINE W REFLEX MICROSCOPIC  TROPONIN I  (HIGH SENSITIVITY)    EKG EKG Interpretation  Date/Time:  Sunday September 08 2021 19:39:52 EDT Ventricular Rate:  96 PR Interval:  184 QRS Duration: 122 QT Interval:  366 QTC Calculation: 463 R Axis:   -33 Text Interpretation: Sinus rhythm Right bundle branch block Confirmed by Thamas Jaegers ([id number]) on 09/08/2021 8:02:30 PM  Radiology DG Chest Port 1 View  Result Date: 09/08/2021 CLINICAL DATA:  Cough, fever. EXAM: PORTABLE CHEST 1 VIEW COMPARISON:  No recent examination available for comparison. FINDINGS: The heart is normal in size. Prominent atherosclerotic calcification of the aortic arch. Lungs are clear without evidence of focal consolidation or pleural effusion. Mild elevation of the left hemidiaphragm. Thoracic spondylosis. No acute osseous abnormality. IMPRESSION: No acute cardiopulmonary process. Electronically Signed   By: Keane Police D.O.   On: 09/08/2021 20:06    Procedures Procedures    Medications Ordered in ED Medications  acetaminophen (TYLENOL) tablet 650 mg (650 mg Oral Given 09/08/21 2013)  sodium chloride 0.9 % bolus 1,000 mL (0 mLs Intravenous Stopped 09/08/21 2242)  piperacillin-tazobactam (ZOSYN) IVPB 3.375 g (0 g Intravenous Stopped 09/08/21 2242)    ED Course/ Medical Decision Making/ A&P                           Medical Decision Making Amount and/or Complexity of Data Reviewed Labs: ordered. Radiology: ordered.  Risk OTC drugs. Prescription drug management.   Review of records shows office visit August 2022 for lower eyelid lesion.  History from family bedside.  Cardiac monitoring showing sinus rhythm.  Diagnostic studies were sent.  Patient is febrile here in the ER 1-1.6 temperature.  Oxygen levels are lowish at 90 to 92% on room air.  Chest x-ray otherwise unremarkable.  Lactic acid elevated 3.2 but white count is normal 8.8.  We will attempt to obtain urinalysis, for possible UTI.  Clinically suspect pneumonia but chest x-ray was unremarkable,  perhaps delayed x-ray findings.  Given Tylenol 650 mg.  Started on Zosyn and IV fluid resuscitation.  Will be admitted to the hospitalist team.     Final Clinical Impression(s) / ED Diagnoses Final diagnoses:  Confusion  Febrile illness  Acute cough    Rx / DC Orders ED Discharge Orders     None         Luna Fuse, MD 09/08/21 2246    Luna Fuse, MD 09/08/21 2248

## 2021-09-08 NOTE — ED Triage Notes (Signed)
Patient BIB EMS from [facility] for evaluation of cough x 2 days and "not acting like himself."  Sister went to visit tonight and was concerned.  Patient reports no complaints

## 2021-09-09 ENCOUNTER — Encounter (HOSPITAL_COMMUNITY): Payer: Self-pay | Admitting: Internal Medicine

## 2021-09-09 DIAGNOSIS — R6520 Severe sepsis without septic shock: Secondary | ICD-10-CM

## 2021-09-09 DIAGNOSIS — I4891 Unspecified atrial fibrillation: Secondary | ICD-10-CM

## 2021-09-09 DIAGNOSIS — R051 Acute cough: Secondary | ICD-10-CM

## 2021-09-09 DIAGNOSIS — I10 Essential (primary) hypertension: Secondary | ICD-10-CM

## 2021-09-09 DIAGNOSIS — J1282 Pneumonia due to coronavirus disease 2019: Secondary | ICD-10-CM | POA: Diagnosis present

## 2021-09-09 DIAGNOSIS — D6959 Other secondary thrombocytopenia: Secondary | ICD-10-CM | POA: Diagnosis present

## 2021-09-09 DIAGNOSIS — I248 Other forms of acute ischemic heart disease: Secondary | ICD-10-CM | POA: Diagnosis present

## 2021-09-09 DIAGNOSIS — A419 Sepsis, unspecified organism: Secondary | ICD-10-CM

## 2021-09-09 DIAGNOSIS — Z7982 Long term (current) use of aspirin: Secondary | ICD-10-CM | POA: Diagnosis not present

## 2021-09-09 DIAGNOSIS — A4189 Other specified sepsis: Secondary | ICD-10-CM | POA: Diagnosis present

## 2021-09-09 DIAGNOSIS — U071 COVID-19: Secondary | ICD-10-CM | POA: Diagnosis present

## 2021-09-09 DIAGNOSIS — R410 Disorientation, unspecified: Secondary | ICD-10-CM | POA: Diagnosis present

## 2021-09-09 DIAGNOSIS — G929 Unspecified toxic encephalopathy: Secondary | ICD-10-CM | POA: Diagnosis present

## 2021-09-09 DIAGNOSIS — R509 Fever, unspecified: Secondary | ICD-10-CM

## 2021-09-09 DIAGNOSIS — L89312 Pressure ulcer of right buttock, stage 2: Secondary | ICD-10-CM | POA: Diagnosis present

## 2021-09-09 DIAGNOSIS — Z79899 Other long term (current) drug therapy: Secondary | ICD-10-CM | POA: Diagnosis not present

## 2021-09-09 DIAGNOSIS — I4821 Permanent atrial fibrillation: Secondary | ICD-10-CM | POA: Diagnosis present

## 2021-09-09 DIAGNOSIS — Z8546 Personal history of malignant neoplasm of prostate: Secondary | ICD-10-CM | POA: Diagnosis not present

## 2021-09-09 DIAGNOSIS — H9190 Unspecified hearing loss, unspecified ear: Secondary | ICD-10-CM | POA: Diagnosis present

## 2021-09-09 DIAGNOSIS — E785 Hyperlipidemia, unspecified: Secondary | ICD-10-CM | POA: Diagnosis present

## 2021-09-09 DIAGNOSIS — Z87891 Personal history of nicotine dependence: Secondary | ICD-10-CM | POA: Diagnosis not present

## 2021-09-09 DIAGNOSIS — N400 Enlarged prostate without lower urinary tract symptoms: Secondary | ICD-10-CM | POA: Diagnosis present

## 2021-09-09 LAB — URINALYSIS, ROUTINE W REFLEX MICROSCOPIC
Bacteria, UA: NONE SEEN
Bilirubin Urine: NEGATIVE
Glucose, UA: 50 mg/dL — AB
Ketones, ur: NEGATIVE mg/dL
Leukocytes,Ua: NEGATIVE
Nitrite: NEGATIVE
Protein, ur: NEGATIVE mg/dL
Specific Gravity, Urine: 1.021 (ref 1.005–1.030)
pH: 5 (ref 5.0–8.0)

## 2021-09-09 LAB — CBC
HCT: 38.4 % — ABNORMAL LOW (ref 39.0–52.0)
Hemoglobin: 13.1 g/dL (ref 13.0–17.0)
MCH: 30.9 pg (ref 26.0–34.0)
MCHC: 34.1 g/dL (ref 30.0–36.0)
MCV: 90.6 fL (ref 80.0–100.0)
Platelets: 126 10*3/uL — ABNORMAL LOW (ref 150–400)
RBC: 4.24 MIL/uL (ref 4.22–5.81)
RDW: 13.7 % (ref 11.5–15.5)
WBC: 7.2 10*3/uL (ref 4.0–10.5)
nRBC: 0 % (ref 0.0–0.2)

## 2021-09-09 LAB — COMPREHENSIVE METABOLIC PANEL
ALT: 18 U/L (ref 0–44)
AST: 29 U/L (ref 15–41)
Albumin: 2.5 g/dL — ABNORMAL LOW (ref 3.5–5.0)
Alkaline Phosphatase: 44 U/L (ref 38–126)
Anion gap: 9 (ref 5–15)
BUN: 15 mg/dL (ref 8–23)
CO2: 20 mmol/L — ABNORMAL LOW (ref 22–32)
Calcium: 7.5 mg/dL — ABNORMAL LOW (ref 8.9–10.3)
Chloride: 105 mmol/L (ref 98–111)
Creatinine, Ser: 0.81 mg/dL (ref 0.61–1.24)
GFR, Estimated: >60 mL/min (ref >60)
Glucose, Bld: 83 mg/dL (ref 70–99)
Potassium: 3.9 mmol/L (ref 3.5–5.1)
Sodium: 134 mmol/L — ABNORMAL LOW (ref 135–145)
Total Bilirubin: 1.2 mg/dL (ref 0.3–1.2)
Total Protein: 4.7 g/dL — ABNORMAL LOW (ref 6.5–8.1)

## 2021-09-09 LAB — C-REACTIVE PROTEIN: CRP: 6.3 mg/dL — ABNORMAL HIGH (ref <1.0)

## 2021-09-09 LAB — PROTIME-INR
INR: 1.2 (ref 0.8–1.2)
Prothrombin Time: 15.2 seconds (ref 11.4–15.2)

## 2021-09-09 LAB — PROCALCITONIN: Procalcitonin: <0.1 ng/mL

## 2021-09-09 LAB — RESP PANEL BY RT-PCR (RSV, FLU A&B, COVID)  RVPGX2
Influenza A by PCR: NEGATIVE
Influenza B by PCR: NEGATIVE
Resp Syncytial Virus by PCR: NEGATIVE
SARS Coronavirus 2 by RT PCR: POSITIVE — AB

## 2021-09-09 LAB — CORTISOL-AM, BLOOD: Cortisol - AM: 16.2 ug/dL (ref 6.7–22.6)

## 2021-09-09 LAB — LACTIC ACID, PLASMA: Lactic Acid, Venous: 0.8 mmol/L (ref 0.5–1.9)

## 2021-09-09 LAB — FERRITIN: Ferritin: 165 ng/mL (ref 24–336)

## 2021-09-09 LAB — TROPONIN I (HIGH SENSITIVITY): Troponin I (High Sensitivity): 22 ng/L — ABNORMAL HIGH (ref <18)

## 2021-09-09 LAB — D-DIMER, QUANTITATIVE: D-Dimer, Quant: 0.64 ug/mL-FEU — ABNORMAL HIGH (ref 0.00–0.50)

## 2021-09-09 MED ORDER — HYDROCHLOROTHIAZIDE 12.5 MG PO TABS
12.5000 mg | ORAL_TABLET | Freq: Every day | ORAL | Status: DC
  Administered 2021-09-09 – 2021-09-11 (×3): 12.5 mg via ORAL
  Filled 2021-09-09 (×3): qty 1

## 2021-09-09 MED ORDER — SIMVASTATIN 20 MG PO TABS
20.0000 mg | ORAL_TABLET | Freq: Every day | ORAL | Status: DC
  Administered 2021-09-09: 20 mg via ORAL
  Filled 2021-09-09: qty 1

## 2021-09-09 MED ORDER — TAMSULOSIN HCL 0.4 MG PO CAPS: 0.4000 mg | ORAL_CAPSULE | Freq: Every day | ORAL | Status: DC

## 2021-09-09 MED ORDER — ASPIRIN 81 MG PO TBEC
81.0000 mg | DELAYED_RELEASE_TABLET | Freq: Every day | ORAL | Status: DC
  Administered 2021-09-09 – 2021-09-11 (×3): 81 mg via ORAL
  Filled 2021-09-09 (×3): qty 1

## 2021-09-09 MED ORDER — NIRMATRELVIR/RITONAVIR (PAXLOVID)TABLET
3.0000 | ORAL_TABLET | Freq: Two times a day (BID) | ORAL | Status: DC
  Administered 2021-09-09 – 2021-09-11 (×5): 3 via ORAL
  Filled 2021-09-09: qty 30

## 2021-09-09 MED ORDER — DILTIAZEM HCL ER COATED BEADS 120 MG PO CP24
120.0000 mg | ORAL_CAPSULE | Freq: Every day | ORAL | Status: DC
Start: 2021-09-10 — End: 2021-09-11
  Administered 2021-09-10 – 2021-09-11 (×2): 120 mg via ORAL
  Filled 2021-09-09 (×2): qty 1

## 2021-09-09 MED ORDER — DILTIAZEM HCL ER COATED BEADS 120 MG PO CP24
240.0000 mg | ORAL_CAPSULE | Freq: Every day | ORAL | Status: DC
  Administered 2021-09-09: 240 mg via ORAL
  Filled 2021-09-09: qty 2

## 2021-09-09 NOTE — Evaluation (Signed)
Physical Therapy Evaluation Patient Details Name: Gary Santiago MRN: [id number] DOB: 1925/03/25 Today's Date: 09/09/2021  History of Present Illness  Patient is 88 y.o. male with PMH significant of A.Fib, HTN and hyperlipidemia was brought in to the emergency department due to confusion, cough and fevers.  Was diagnosed with COVID-19 pneumonia.    Clinical Impression  Gary Santiago is 88 y.o. male admitted with above HPI and diagnosis. Patient is currently limited by functional impairments below (see PT problem list). Patient lives at [place] and is independent with no device per self report at baseline, pt questionable historian and no family present to confirm. Currently he requires Min assist for transfers and short bout of gait. Pt has posterior lean in standing and required VC's to improve posture and assist to ambulate short steps by EOB. Patient will benefit from continued skilled PT interventions to address impairments and progress independence with mobility, recommending return to ALF with frequent/constant supervision and HHPT. Acute PT will follow and progress as able.        Recommendations for follow up therapy are one component of a multi-disciplinary discharge planning process, led by the attending physician.  Recommendations may be updated based on patient status, additional functional criteria and insurance authorization.  PT Recommendation   Follow Up Recommendations Home health PT  [Return to ALF with HHPT services] Filed 09/09/2021 1200  Assistance recommended at discharge Frequent or constant Supervision/Assistance Filed 09/09/2021 1200  Patient can return home with the following A little help with walking and/or transfers, A little help with bathing/dressing/bathroom, Assistance with cooking/housework, Direct supervision/assist for medications management, Assist for transportation, Help with stairs or ramp for entrance, Direct supervision/assist for  financial management Filed 09/09/2021 1200  Functional Status Assessment Patient has had a recent decline in their functional status and demonstrates the ability to make significant improvements in function in a reasonable and predictable amount of time. Filed 09/09/2021 1200  PT equipment None recommended by PT Filed 09/09/2021 1200    Precautions / Restrictions   Fall     Mobility  Bed Mobility Overal bed mobility: Needs Assistance Bed Mobility: Supine to Sit, Sit to Supine     Supine to sit: Supervision Sit to supine: Min assist   General bed mobility comments: pt able to pivot to EOB without assist, light assist to bring LE's back on to bed at EOS    Transfers Overall transfer level: Needs assistance Equipment used: 1 person hand held assist Transfers: Sit to/from Stand, Bed to chair/wheelchair/BSC Sit to Stand: Min assist   Step pivot transfers: Min assist       General transfer comment: min assist to steady with rise from edge of stretcher and to take small steps to [facility]. pt required assist to stabilize commode with sit<>stand due to slight posterior lean.    Ambulation/Gait Ambulation/Gait assistance: Min assist Gait Distance (Feet): 4 Feet Assistive device: 1 person hand held assist Gait Pattern/deviations: Step-through pattern, Decreased stride length, Shuffle Gait velocity: decr     General Gait Details: HHA to steady with small steps to turn to [facility] and small steps forward to move back to EOB.  Stairs            Wheelchair Mobility    Modified Rankin (Stroke Patients Only)       Balance Overall balance assessment: Needs assistance Sitting-balance support: Feet supported Sitting balance-Leahy Scale: Good   Postural control: Posterior lean Standing balance support: Single extremity supported, No upper extremity supported  Standing balance-Leahy Scale: Fair Standing balance comment: pt uable to complete pericare without assist.                              Pertinent Vitals/Pain Pain Assessment Pain Assessment: No/denies pain    Home Living Family/patient expects to be discharged to:: Skilled nursing facility                        Prior Function                       Hand Dominance        Extremity/Trunk Assessment                Communication      Cognition Arousal/Alertness: Awake/alert Behavior During Therapy: [facility] for tasks assessed/performed Overall Cognitive Status: History of cognitive impairments - at baseline                                            09/09/21 1200  PT - End of Session  Equipment Utilized During Treatment Gait belt  Activity Tolerance Patient tolerated treatment well  Patient left in bed;with call bell/phone within reach  Nurse Communication Mobility status  PT Assessment  PT Recommendation/Assessment Patient needs continued PT services  PT Visit Diagnosis Unsteadiness on feet (R26.81);Muscle weakness (generalized) (M62.81);Difficulty in walking, not elsewhere classified (R26.2)  PT Problem List Decreased strength;Decreased activity tolerance;Decreased balance;Decreased mobility;Decreased safety awareness;Decreased knowledge of use of DME;Decreased knowledge of precautions;Decreased cognition  PT Plan  PT Frequency (ACUTE ONLY) Min 3X/week  PT Treatment/Interventions (ACUTE ONLY) DME instruction;Gait training;Stair training;Functional mobility training;Balance training;Therapeutic exercise;Therapeutic activities;Neuromuscular re-education;Cognitive remediation;Patient/family education  AM-PAC PT "6 Clicks" Mobility Outcome Measure (Version 2)  Help needed turning from your back to your side while in a flat bed without using bedrails? 3  Help needed moving from lying on your back to sitting on the side of a flat bed without using bedrails? 3  Help needed moving to and from a bed to a chair (including a wheelchair)? 3  Help needed  standing up from a chair using your arms (e.g., wheelchair or bedside chair)? 3  Help needed to walk in hospital room? 3  Help needed climbing 3-5 steps with a railing?  3  6 Click Score 18  Consider Recommendation of Discharge To: Home with [facility]  Progressive Mobility  What is the highest level of mobility based on the progressive mobility assessment? Level 4 (Walks with assist in room) - Balance while marching in place and cannot step forward and back - Complete  Activity Transferred to/from [facility]  PT Recommendation  Follow Up Recommendations Home health PT (Return to ALF with HHPT services)  Assistance recommended at discharge Frequent or constant Supervision/Assistance  Patient can return home with the following A little help with walking and/or transfers;A little help with bathing/dressing/bathroom;Assistance with cooking/housework;Direct supervision/assist for medications management;Assist for transportation;Help with stairs or ramp for entrance;Direct supervision/assist for financial management  Functional Status Assessment Patient has had a recent decline in their functional status and demonstrates the ability to make significant improvements in function in a reasonable and predictable amount of time.  PT equipment None recommended by PT  Individuals Consulted  Consulted and Agree with Results and Recommendations Patient  Acute Rehab PT Goals  Patient Stated Goal recover and stay independent  PT Goal Formulation With patient  Time For Goal Achievement 09/23/21  Potential to Achieve Goals Good  PT Time Calculation  PT Start Time (ACUTE ONLY) 1150  PT Stop Time (ACUTE ONLY) 1216  PT Time Calculation (min) (ACUTE ONLY) 26 min  PT General Charges  $$ ACUTE PT VISIT 1 Visit  PT Evaluation  $PT Eval Low Complexity 1 Low  PT Treatments  $Therapeutic Activity 8-22 mins    Verner Mould, DPT Acute Rehabilitation Services Office [phone] Pager [phone]  09/09/21 4:04 PM

## 2021-09-09 NOTE — H&P (Signed)
History and Physical    Patient: Gary Santiago:[id number] DOB: [birth year] DOA: 09/08/2021 DOS: the patient was seen and examined on 09/09/2021 PCP: System, Provider Not In  Patient coming from: SNF  Chief Complaint:  Chief Complaint  Patient presents with   Code Sepsis   HPI: Gary Santiago is a 88 y.o. male with medical history significant of A.Fib, HTN.  Pt in to ED with c/o confusion, cough, fevers.  Symptoms onset 2 days ago, persistent.  Denies headache, CP, abd pain.  No vomiting nor diarrhea.   Review of Systems: As mentioned in the history of present illness. All other systems reviewed and are negative. Past Medical History:  Diagnosis Date   Atrial fibrillation (HCC)    BPH (benign prostatic hyperplasia)    HLD (hyperlipidemia)    HTN (hypertension)    Prostate cancer [facility])    Past Surgical History:  Procedure Laterality Date   HERNIA REPAIR     2 prior hernia repairs   Social History:  reports that he has quit smoking. He does not have any smokeless tobacco history on file. No history on file for alcohol use and drug use.  No Known Allergies  No family history on file.  Prior to Admission medications   Medication Sig Start Date End Date Taking? Authorizing Provider  aspirin 81 MG tablet Take 81 mg by mouth daily.   Yes [provider]  Cholecalciferol (VITAMIN D-3 PO) Take 1,000 Units by mouth daily.   Yes [provider]  diltiazem (CARDIZEM CD) 240 MG 24 hr capsule Take 240 mg by mouth daily. 08/29/21  Yes [provider]  hydrochlorothiazide (HYDRODIURIL) 12.5 MG tablet Take 12.5 mg by mouth daily. 08/29/21  Yes [provider]  Multiple Vitamin (MULTIVITAMIN) tablet Take 1 tablet by mouth daily.   Yes [provider]  simvastatin (ZOCOR) 20 MG tablet Take 20 mg by mouth daily. 08/29/21  Yes [provider]  tamsulosin (FLOMAX) 0.4 MG CAPS capsule Take 0.4 mg by mouth at bedtime. 08/29/21  Yes  [provider]    Physical Exam: Vitals:   09/08/21 2215 09/08/21 2230 09/08/21 2245 09/08/21 2300  BP: (!) 127/56 (!) 132/57 (!) 127/58 (!) 129/59  Pulse: 77 75 76 73  Resp: (!) 22 (!) 23 (!) 22 (!) 23  Temp:      TempSrc:      SpO2: 92% 91% 91% 91%   Constitutional: NAD, calm, comfortable Eyes: PERRL, lids and conjunctivae normal ENMT: Mucous membranes are moist. Posterior pharynx clear of any exudate or lesions.Normal dentition.  Neck: normal, supple, no masses, no thyromegaly Respiratory: Rales Cardiovascular: Tachycardia Abdomen: no tenderness, no masses palpated. No hepatosplenomegaly. Bowel sounds positive.  Musculoskeletal: no clubbing / cyanosis. No joint deformity upper and lower extremities. Good ROM, no contractures. Normal muscle tone.  Skin: no rashes, lesions, ulcers. No induration Neurologic: CN 2-12 grossly intact. Sensation intact, DTR normal. Strength 5/5 in all 4.  Psychiatric: Awake, alert, verbal following commands.  Data Reviewed:    CBC    Component Value Date/Time   WBC 8.8 09/08/2021 1930   RBC 4.84 09/08/2021 1930   HGB 14.9 09/08/2021 1930   HCT 44.2 09/08/2021 1930   PLT 144 (L) 09/08/2021 1930   MCV 91.3 09/08/2021 1930   MCH 30.8 09/08/2021 1930   MCHC 33.7 09/08/2021 1930   RDW 13.5 09/08/2021 1930   LYMPHSABS 0.6 (L) 09/08/2021 1930   MONOABS 0.5 09/08/2021 1930   EOSABS 0.0 09/08/2021  1930   BASOSABS 0.0 09/08/2021 1930   CMP     Component Value Date/Time   NA 135 09/08/2021 1930   K 3.9 09/08/2021 1930   CL 100 09/08/2021 1930   CO2 24 09/08/2021 1930   GLUCOSE 168 (H) 09/08/2021 1930   BUN 20 09/08/2021 1930   CREATININE 1.19 09/08/2021 1930   CALCIUM 9.1 09/08/2021 1930   PROT 7.3 09/08/2021 1930   ALBUMIN 4.0 09/08/2021 1930   AST 29 09/08/2021 1930   ALT 25 09/08/2021 1930   ALKPHOS 75 09/08/2021 1930   BILITOT 1.9 (H) 09/08/2021 1930   GFRNONAA 57 (L) 09/08/2021 1930   GFRAA  10/30/2008 1515    >60         The eGFR has been calculated using the MDRD equation. This calculation has not been validated in all clinical situations. eGFR's persistently <60 mL/min signify possible Chronic Kidney Disease.   CXR = no acute finding.  Assessment and Plan: * Sepsis [facility]) Patient meets criteria for sepsis at time of admission. Specifically the patient has at least 2 out of 4 SIRS criteria, namely: fever and tachypnea The currently suspected source of infection is Unknown source, possibly PNA based on cough, low O2 sat. Lactic acid: 3.4 Blood pressure: 129/59 IVF: 1L bolus in ED and LR at 100 Antibiotics: zosyn Cultures pending Check procalcitonin UA pending Covid pending Tele monitor Got zosyn in ED Will put on rocephin + azithromycin for presumed CAP coverage for the moment.    Atrial fibrillation ([place]) Hold home cardizem Use short acting if needed Tele monitor Doesn't appear to be on any AC  HTN (hypertension) Hold home BP meds in setting of sepsis.      Advance Care Planning:   Code Status: Full Code (not yet reviewed with family, code status assumed)  Consults: None  Family Communication: No family in room  Severity of Illness: The appropriate patient status for this patient is OBSERVATION. Observation status is judged to be reasonable and necessary in order to provide the required intensity of service to ensure the patient's safety. The patient's presenting symptoms, physical exam findings, and initial radiographic and laboratory data in the context of their medical condition is felt to place them at decreased risk for further clinical deterioration. Furthermore, it is anticipated that the patient will be medically stable for discharge from the hospital within 2 midnights of admission.   Author: Etta Quill., DO 09/09/2021 12:09 AM  For on call review [email or web address].

## 2021-09-09 NOTE — ED Notes (Signed)
Patient reports he needs to have a bowel movement.  Patient assisted to stand and assisted to use bedside commode.  New sheets and gown provided.  Attempted to use external catheter without success.  Patient continues to take it out.  Adult brief placed

## 2021-09-09 NOTE — ED Notes (Signed)
Attempted to place  on patient.  Patient will keep on for short amount of time and then removes it.  Will continue to monitor sats

## 2021-09-09 NOTE — Discharge Instructions (Signed)
Start flomax/zocor/full strength cardiazem 3 days after finishing paxlovid  Information on my medicine - Paxlovid (nirmatrelvir/ritonavir)  This medication education was reviewed with me or my healthcare representative as part of my discharge preparation.    Why was PAXLOVID prescribed for you? It treats mild to moderate COVID-19.  It may help people who are at high risk of developing severe illness.  The medication works by limiting the spread of the virus in your body.  The FDA has allowed emergency use of this medication.  What do You need to know about PAXLOVID ? Take with or without food.  If it upsets your stomach take with food. This drug comes in a blister card that has both the morning and evening dose in it.  Be sure you know how many tablets to take for each dose and which tablets go together.  If you take this drug the wrong way, it may not work as well or you could have more side effects.  Do not take tablets out of the blister card until you are ready to take your dose. Do not take this drug for longer than you were told by your doctor or pharmacist. This drug interacts with many other drugs.  Check with your doctor or pharmacist to make sure that it is safe for you to add new medications including OTC, natural products and vitamins while you are completing your course of Paxlovid. After getting this drug, you must continue to isolate and do other things to prevent spreading infection to others.  Wear a mask, social distance, do not share personal items, clean and disinfect high touch surfaces, and wash hands often. Tell your doctor if you are pregnant, plan on getting pregnant, or are breast-feeding.  You will need to talk about benefits and risks to you and your baby. Birth control pills and other hormone-based birth control may not work as well to prevent pregnancy.  Use some other kind of birth control also like a condom when taking this drug.  What do you do if you miss a  dose? If you miss a dose, take it as soon as you remember unless it is more than 8 hours late.  If it is more than 8 hours late, skip the missed dose.  Take the next dose at the normal time.  DO NOT TAKE EXTRA or 2 doses at the same time to make up the missed dose.  Important Safety Information Side effects that may happen but usually do not require medical attention - report if they continue:  Change in taste, diarrhea, muscle pain, stomach pain or nausea.  Tell your doctor or get medical help right away if you have any of the following symptoms: Signs of allergic reaction, like rash, hives, itching, red, swollen, blistered, or peeling skin with or without fever, tightness of the chest or throat, trouble breathing. Signs of liver problems like dark urine, tiredness, decreased appetite, light colored stools, throwing up or yellowing of skin or eyes. Sign of high blood pressure like very bad headache or dizziness, passing out, eyesight changes.  This website has more information on Paxlovid (nirmatrelvir & ritonavir) : [email or web address]

## 2021-09-09 NOTE — ED Notes (Signed)
Dr. [name] would like a phone call from rounding MD. [phone].

## 2021-09-09 NOTE — Progress Notes (Addendum)
PROGRESS NOTE    Gary Santiago  WLN:[id number] DOB: Oct 27, 1925 DOA: 09/08/2021 PCP: System, Provider Not In   Brief Narrative:  Gary Santiago is a 88 y.o. male with medical history significant of A.Fib, HTN and hyperlipidemia was brought in to the emergency department due to confusion, cough and fevers.  Was diagnosed with COVID-19 pneumonia.  Assessment & Plan:   Principal Problem:   Sepsis ([place]) Active Problems:   Atrial fibrillation (HCC)   HTN (hypertension)  Severe sepsis secondary to COVID-19 pneumonia, POA: Patient met criteria for severe sepsis based on fever of 101.6, tachypnea and lactic acid of 3.2.  Source likely COVID-19 pneumonia.  Procalcitonin within normal range, arguing against bacterial pneumonia and thus I will discontinue antibiotics.  CRP pending, D-dimer minimally elevated which is normal for his age adjusted.  No suspicion of PE.  Discussed with POA Dr. Hilarie Fredrickson, he consented and thus will start on Paxlovid. Patient was encouraged to prone, out of bed to chair, to use incentive spirometry and flutter valve.  Permanent atrial fibrillation: Rates controlled.  We will resume home dose of Cardizem CD.  He is not on any anticoagulation, likely due to his age.  Acute toxic encephalopathy: Likely in the setting of severe sepsis with COVID-pneumonia.  Patient was fully alert and mostly oriented and per POA, this is his baseline.  Hyperlipidemia: Resume Zocor.  BPH: Resume Flomax.  Hypertension: Resume hydrochlorothiazide.  Acute thrombocytopenia: Likely in the setting of sepsis.  No signs of bleeding.  Monitor.  Elevated troponin: Elevated but flat, indicating demand ischemia.  No signs or symptoms of ACS.  DVT prophylaxis: enoxaparin (LOVENOX) injection 40 mg Start: 09/09/21 1000   Code Status: Full Code  Family Communication:  None present at bedside.  Plan of care discussed with Dr. Hilarie Fredrickson, who happens to be patient's nephew and healthcare  POA.  Status is: Observation The patient will require care spanning > 2 midnights and should be moved to inpatient because: Needs another night of observation, needs to be seen by PT OT.   Estimated body mass index is 24.35 kg/m as calculated from the following:   Height as of 02/08/15: $RemoveBefo'5\' 11"'LmGfxeRUPQE$  (1.803 m).   Weight as of 02/08/15: 79.2 kg.    Nutritional Assessment: There is no height or weight on file to calculate BMI.. Seen by dietician.  I agree with the assessment and plan as outlined below: Nutrition Status:        . Skin Assessment: I have examined the patient's skin and I agree with the wound assessment as performed by the wound care RN as outlined below:    Consultants:  None  Procedures:  None  Antimicrobials:  Anti-infectives (From admission, onward)    Start     Dose/Rate Route Frequency Ordered Stop   09/09/21 0400  cefTRIAXone (ROCEPHIN) 2 g in sodium chloride 0.9 % 100 mL IVPB        2 g 200 mL/hr over 30 Minutes Intravenous Every 24 hours 09/08/21 2346     09/09/21 0000  azithromycin (ZITHROMAX) 500 mg in sodium chloride 0.9 % 250 mL IVPB        500 mg 250 mL/hr over 60 Minutes Intravenous Every 24 hours 09/08/21 2346     09/08/21 2100  piperacillin-tazobactam (ZOSYN) IVPB 3.375 g        3.375 g 100 mL/hr over 30 Minutes Intravenous  Once 09/08/21 2050 09/08/21 2242         Subjective: Patient seen and examined  the ED.  He is fully alert and mostly oriented, at his baseline.  He denies any complaint, any shortness of breath.  Objective: Vitals:   09/09/21 0518 09/09/21 0530 09/09/21 0600 09/09/21 0640  BP:  131/61 134/83 (!) 147/66  Pulse:  72 72 84  Resp:  (!) 24 (!) 23 (!) 26  Temp: 99.4 F (37.4 C)     TempSrc: Oral     SpO2:  91% 92% 91%    Intake/Output Summary (Last 24 hours) at 09/09/2021 0836 Last data filed at 09/09/2021 4076 Gross per 24 hour  Intake 1450 ml  Output --  Net 1450 ml   There were no vitals filed for this  visit.  Examination:  General exam: Appears calm and comfortable  Respiratory system: Rhonchi at the bases bilaterally. Respiratory effort normal. Cardiovascular system: S1 & S2 heard, RRR. No JVD, murmurs, rubs, gallops or clicks. No pedal edema. Gastrointestinal system: Abdomen is nondistended, soft and nontender. No organomegaly or masses felt. Normal bowel sounds heard. Central nervous system: Alert and oriented. No focal neurological deficits. Extremities: Symmetric 5 x 5 power. Skin: No rashes, lesions or ulcers    Data Reviewed: I have personally reviewed following labs and imaging studies  CBC: Recent Labs  Lab 09/08/21 1930 09/09/21 0742  WBC 8.8 7.2  NEUTROABS 7.7  --   HGB 14.9 13.1  HCT 44.2 38.4*  MCV 91.3 90.6  PLT 144* 808*   Basic Metabolic Panel: Recent Labs  Lab 09/08/21 1930 09/09/21 0742  NA 135 134*  K 3.9 3.9  CL 100 105  CO2 24 20*  GLUCOSE 168* 83  BUN 20 15  CREATININE 1.19 0.81  CALCIUM 9.1 7.5*   GFR: CrCl cannot be calculated (Unknown ideal weight.). Liver Function Tests: Recent Labs  Lab 09/08/21 1930 09/09/21 0742  AST 29 29  ALT 25 18  ALKPHOS 75 44  BILITOT 1.9* 1.2  PROT 7.3 4.7*  ALBUMIN 4.0 2.5*   No results for input(s): "LIPASE", "AMYLASE" in the last 168 hours. No results for input(s): "AMMONIA" in the last 168 hours. Coagulation Profile: Recent Labs  Lab 09/09/21 0742  INR 1.2   Cardiac Enzymes: No results for input(s): "CKTOTAL", "CKMB", "CKMBINDEX", "TROPONINI" in the last 168 hours. BNP (last 3 results) No results for input(s): "PROBNP" in the last 8760 hours. HbA1C: No results for input(s): "HGBA1C" in the last 72 hours. CBG: Recent Labs  Lab 09/08/21 2013  GLUCAP 125*   Lipid Profile: No results for input(s): "CHOL", "HDL", "LDLCALC", "TRIG", "CHOLHDL", "LDLDIRECT" in the last 72 hours. Thyroid Function Tests: No results for input(s): "TSH", "T4TOTAL", "FREET4", "T3FREE", "THYROIDAB" in the last  72 hours. Anemia Panel: No results for input(s): "VITAMINB12", "FOLATE", "FERRITIN", "TIBC", "IRON", "RETICCTPCT" in the last 72 hours. Sepsis Labs: Recent Labs  Lab 09/08/21 1930 09/09/21 0054  LATICACIDVEN 3.2* 0.8    Recent Results (from the past 240 hour(s))  Resp panel by RT-PCR (RSV, Flu A&B, Covid) Anterior Nasal Swab     Status: Abnormal   Collection Time: 09/08/21  7:48 PM   Specimen: Anterior Nasal Swab  Result Value Ref Range Status   SARS Coronavirus 2 by RT PCR POSITIVE (A) NEGATIVE Final    Comment: (NOTE) SARS-CoV-2 target nucleic acids are DETECTED.  The SARS-CoV-2 RNA is generally detectable in upper respiratory specimens during the acute phase of infection. Positive results are indicative of the presence of the identified virus, but do not rule out bacterial infection or co-infection with  other pathogens not detected by the test. Clinical correlation with patient history and other diagnostic information is necessary to determine patient infection status. The expected result is Negative.  Fact Sheet for Patients: EntrepreneurPulse.com.au  Fact Sheet for Healthcare Providers: IncredibleEmployment.be  This test is not yet approved or cleared by the [place] FDA and  has been authorized for detection and/or diagnosis of SARS-CoV-2 by FDA under an Emergency Use Authorization (EUA).  This EUA will remain in effect (meaning this test can be used) for the duration of  the COVID-19 declaration under Section 564(b)(1) of the A ct, 21 U.S.C. section 360bbb-3(b)(1), unless the authorization is terminated or revoked sooner.     Influenza A by PCR NEGATIVE NEGATIVE Final   Influenza B by PCR NEGATIVE NEGATIVE Final    Comment: (NOTE) The Xpert Xpress SARS-CoV-2/FLU/RSV plus assay is intended as an aid in the diagnosis of influenza from Nasopharyngeal swab specimens and should not be used as a sole basis for treatment. Nasal  washings and aspirates are unacceptable for Xpert Xpress SARS-CoV-2/FLU/RSV testing.  Fact Sheet for Patients: EntrepreneurPulse.com.au  Fact Sheet for Healthcare Providers: IncredibleEmployment.be  This test is not yet approved or cleared by the [place] FDA and has been authorized for detection and/or diagnosis of SARS-CoV-2 by FDA under an Emergency Use Authorization (EUA). This EUA will remain in effect (meaning this test can be used) for the duration of the COVID-19 declaration under Section 564(b)(1) of the Act, 21 U.S.C. section 360bbb-3(b)(1), unless the authorization is terminated or revoked.     Resp Syncytial Virus by PCR NEGATIVE NEGATIVE Final    Comment: (NOTE) Fact Sheet for Patients: EntrepreneurPulse.com.au  Fact Sheet for Healthcare Providers: IncredibleEmployment.be  This test is not yet approved or cleared by the [place] FDA and has been authorized for detection and/or diagnosis of SARS-CoV-2 by FDA under an Emergency Use Authorization (EUA). This EUA will remain in effect (meaning this test can be used) for the duration of the COVID-19 declaration under Section 564(b)(1) of the Act, 21 U.S.C. section 360bbb-3(b)(1), unless the authorization is terminated or revoked.  Performed at [facility], [place] [address] [place], [place] [address]      Radiology Studies: [facility] Chest Port 1 View  Result Date: 09/08/2021 CLINICAL DATA:  Cough, fever. EXAM: PORTABLE CHEST 1 VIEW COMPARISON:  No recent examination available for comparison. FINDINGS: The heart is normal in size. Prominent atherosclerotic calcification of the aortic arch. Lungs are clear without evidence of focal consolidation or pleural effusion. Mild elevation of the left hemidiaphragm. Thoracic spondylosis. No acute osseous abnormality. IMPRESSION: No acute cardiopulmonary process. Electronically Signed    By: Keane Police D.O.   On: 09/08/2021 20:06    Scheduled Meds:  aspirin  81 mg Oral Daily   diltiazem  240 mg Oral Daily   enoxaparin (LOVENOX) injection  40 mg Subcutaneous Q24H   simvastatin  20 mg Oral Daily   tamsulosin  0.4 mg Oral QHS   Continuous Infusions:  azithromycin Stopped (09/09/21 0221)   cefTRIAXone (ROCEPHIN)  IV Stopped (09/09/21 4287)   lactated ringers 100 mL/hr at 09/09/21 0048     LOS: 0 days   Darliss Cheney, MD [facility]  09/09/2021, 8:36 AM   *Please note that this is a verbal dictation therefore any spelling or grammatical errors are due to the "[place]" system interpretation.  Please page via [place] and do not message via secure chat for urgent patient care matters. Secure chat  can be used for non urgent patient care matters.  How to contact the [facility] Attending or Consulting provider [place] or covering provider during after hours [place], for this patient?  Check the care team in [facility] and look for a) attending/consulting [facility] provider listed and b) the [facility] team listed. Page or secure chat [phone]. Log into [email or web address] and use [place]'s universal password to access. If you do not have the password, please contact the hospital operator. Locate the [facility] provider you are looking for under [facility] and page to a number that you can be directly reached. If you still have difficulty reaching the provider, please page the [facility] (Director on Call) for the Hospitalists listed on amion for assistance.

## 2021-09-09 NOTE — ED Notes (Signed)
Pt received lunch tray 

## 2021-09-10 DIAGNOSIS — A419 Sepsis, unspecified organism: Secondary | ICD-10-CM | POA: Diagnosis not present

## 2021-09-10 DIAGNOSIS — R6520 Severe sepsis without septic shock: Secondary | ICD-10-CM | POA: Diagnosis not present

## 2021-09-10 DIAGNOSIS — L8990 Pressure ulcer of unspecified site, unspecified stage: Secondary | ICD-10-CM | POA: Insufficient documentation

## 2021-09-10 LAB — CBC WITH DIFFERENTIAL/PLATELET
Abs Immature Granulocytes: 0.02 10*3/uL (ref 0.00–0.07)
Basophils Absolute: 0 10*3/uL (ref 0.0–0.1)
Basophils Relative: 1 %
Eosinophils Absolute: 0.2 10*3/uL (ref 0.0–0.5)
Eosinophils Relative: 3 %
HCT: 36.5 % — ABNORMAL LOW (ref 39.0–52.0)
Hemoglobin: 12.4 g/dL — ABNORMAL LOW (ref 13.0–17.0)
Immature Granulocytes: 0 %
Lymphocytes Relative: 17 %
Lymphs Abs: 1 10*3/uL (ref 0.7–4.0)
MCH: 31 pg (ref 26.0–34.0)
MCHC: 34 g/dL (ref 30.0–36.0)
MCV: 91.3 fL (ref 80.0–100.0)
Monocytes Absolute: 0.6 10*3/uL (ref 0.1–1.0)
Monocytes Relative: 10 %
Neutro Abs: 4.2 10*3/uL (ref 1.7–7.7)
Neutrophils Relative %: 69 %
Platelets: 124 10*3/uL — ABNORMAL LOW (ref 150–400)
RBC: 4 MIL/uL — ABNORMAL LOW (ref 4.22–5.81)
RDW: 13.7 % (ref 11.5–15.5)
WBC: 6 10*3/uL (ref 4.0–10.5)
nRBC: 0 % (ref 0.0–0.2)

## 2021-09-10 LAB — BASIC METABOLIC PANEL
Anion gap: 7 (ref 5–15)
BUN: 18 mg/dL (ref 8–23)
CO2: 24 mmol/L (ref 22–32)
Calcium: 8.2 mg/dL — ABNORMAL LOW (ref 8.9–10.3)
Chloride: 106 mmol/L (ref 98–111)
Creatinine, Ser: 0.94 mg/dL (ref 0.61–1.24)
GFR, Estimated: >60 mL/min (ref >60)
Glucose, Bld: 91 mg/dL (ref 70–99)
Potassium: 3.3 mmol/L — ABNORMAL LOW (ref 3.5–5.1)
Sodium: 137 mmol/L (ref 135–145)

## 2021-09-10 LAB — URINE CULTURE: Culture: NO GROWTH

## 2021-09-10 MED ORDER — NIRMATRELVIR/RITONAVIR (PAXLOVID)TABLET: 3.0000 | ORAL_TABLET | Freq: Two times a day (BID) | ORAL | 0 refills | Status: AC

## 2021-09-10 MED ORDER — POTASSIUM CHLORIDE CRYS ER 20 MEQ PO TBCR
40.0000 meq | EXTENDED_RELEASE_TABLET | Freq: Once | ORAL | Status: AC
  Administered 2021-09-10: 40 meq via ORAL
  Filled 2021-09-10: qty 2

## 2021-09-10 MED ORDER — FUROSEMIDE 10 MG/ML IJ SOLN
40.0000 mg | Freq: Once | INTRAMUSCULAR | Status: AC
Start: 2021-09-10 — End: 2021-09-10
  Administered 2021-09-10: 40 mg via INTRAVENOUS
  Filled 2021-09-10: qty 4

## 2021-09-10 NOTE — Progress Notes (Signed)
PROGRESS NOTE    Gary Santiago  MLY:[id number] DOB: 12/16/1925 DOA: 09/08/2021 PCP: System, Provider Not In   Brief Narrative:  Gary Santiago is a 88 y.o. male with medical history significant of A.Fib, HTN and hyperlipidemia was brought in to the emergency department due to confusion, cough and fevers.  Was diagnosed with COVID-19 pneumonia.  Assessment & Plan:   Principal Problem:   Severe sepsis ([place]) Active Problems:   Atrial fibrillation (HCC)   HTN (hypertension)   Sepsis (HCC)   Pressure injury of skin  Severe sepsis secondary to COVID-19 pneumonia, POA: Patient met criteria for severe sepsis based on fever of 101.6, tachypnea and lactic acid of 3.2.  Source likely COVID-19 pneumonia.  Procalcitonin within normal range, arguing against bacterial pneumonia and thus I will discontinue antibiotics.  CRP pending, D-dimer minimally elevated which is normal for his age adjusted.  No suspicion of PE.  Discussed with POA Dr. Hilarie Fredrickson, he consented and thus we started on Paxlovid.  Per pharmacy, there were some interactions with the medication so her diltiazem was reduced in dose.  Patient sounds a little congested today, but is still not hypoxic and fully alert and oriented.  We will give him 1 dose of IV Lasix 40 mg.  Permanent atrial fibrillation: Rates controlled.  Continue Cardizem.  He is not on any anticoagulation, likely due to his age.  Acute toxic encephalopathy: Likely in the setting of severe sepsis with COVID-pneumonia.  Patient has remained fully alert and oriented since yesterday.  Hyperlipidemia: Resumed Zocor.  BPH: Resume Flomax.  Hypertension: Resume hydrochlorothiazide.  Acute thrombocytopenia: Likely in the setting of sepsis.  No signs of bleeding.  Monitor.  Elevated troponin: Elevated but flat, indicating demand ischemia.  No signs or symptoms of ACS.  Diarrhea: Checking C. difficile.  Discharge planning: Patient's nephew and POA is trying to find  out from the facility if they are capable of providing quarantine for this patient as well as PT OT.  He is requesting for potential discharge tomorrow.  DVT prophylaxis: enoxaparin (LOVENOX) injection 40 mg Start: 09/09/21 1000   Code Status: Full Code  Family Communication:  None present at bedside.  Plan of care discussed with Dr. Hilarie Fredrickson, who happens to be patient's nephew and healthcare POA.  Status is: Inpatient Remains inpatient appropriate because: Family needs some time to make arrangements for him to go back to the facility.     Estimated body mass index is 24.35 kg/m as calculated from the following:   Height as of 02/08/15: $RemoveBefo'5\' 11"'fGCsPKAcZwn$  (1.803 m).   Weight as of 02/08/15: 79.2 kg.  Pressure Injury 09/09/21 Buttocks Right Stage 2 -  Partial thickness loss of dermis presenting as a shallow open injury with a red, pink wound bed without slough. Stage 2 Right buttock(localized) with non-blanchable redness/stage1 Bilateral buttock (Active)  09/09/21 1730  Location: Buttocks  Location Orientation: Right  Staging: Stage 2 -  Partial thickness loss of dermis presenting as a shallow open injury with a red, pink wound bed without slough.  Wound Description (Comments): Stage 2 Right buttock(localized) with non-blanchable redness/stage1 Bilateral buttock  Present on Admission: Yes   Nutritional Assessment: There is no height or weight on file to calculate BMI.. Seen by dietician.  I agree with the assessment and plan as outlined below: Nutrition Status:        . Skin Assessment: I have examined the patient's skin and I agree with the wound assessment as performed by the wound care  RN as outlined below: Pressure Injury 09/09/21 Buttocks Right Stage 2 -  Partial thickness loss of dermis presenting as a shallow open injury with a red, pink wound bed without slough. Stage 2 Right buttock(localized) with non-blanchable redness/stage1 Bilateral buttock (Active)  09/09/21 1730  Location:  Buttocks  Location Orientation: Right  Staging: Stage 2 -  Partial thickness loss of dermis presenting as a shallow open injury with a red, pink wound bed without slough.  Wound Description (Comments): Stage 2 Right buttock(localized) with non-blanchable redness/stage1 Bilateral buttock  Present on Admission: Yes    Consultants:  None  Procedures:  None  Antimicrobials:  Anti-infectives (From admission, onward)    Start     Dose/Rate Route Frequency Ordered Stop   09/09/21 1200  nirmatrelvir/ritonavir EUA (PAXLOVID) 3 tablet        3 tablet Oral 2 times daily 09/09/21 1111 09/14/21 0959   09/09/21 0400  cefTRIAXone (ROCEPHIN) 2 g in sodium chloride 0.9 % 100 mL IVPB  Status:  Discontinued        2 g 200 mL/hr over 30 Minutes Intravenous Every 24 hours 09/08/21 2346 09/09/21 1126   09/09/21 0000  azithromycin (ZITHROMAX) 500 mg in sodium chloride 0.9 % 250 mL IVPB  Status:  Discontinued        500 mg 250 mL/hr over 60 Minutes Intravenous Every 24 hours 09/08/21 2346 09/09/21 1126   09/08/21 2100  piperacillin-tazobactam (ZOSYN) IVPB 3.375 g        3.375 g 100 mL/hr over 30 Minutes Intravenous  Once 09/08/21 2050 09/08/21 2242         Subjective:  Patient seen and examined.  He has no complaints.  He is fully alert and oriented.  He tells me that he does have some shortness of breath at baseline and it is no different today.  He also tells me that he does have intermittent diarrhea as well.  Objective: Vitals:   09/10/21 0145 09/10/21 0540 09/10/21 1236 09/10/21 1347  BP: 121/62 (!) 121/52 (!) 146/97   Pulse: 66 (!) 54 75 80  Resp: (!) 22 18 (!) 22   Temp:  (!) 97.4 F (36.3 C) (!) 97 F (36.1 C)   TempSrc:  Oral Axillary   SpO2: 90% 90% 94% (!) 87%    Intake/Output Summary (Last 24 hours) at 09/10/2021 1350 Last data filed at 09/10/2021 1321 Gross per 24 hour  Intake 2083.47 ml  Output 400 ml  Net 1683.47 ml    There were no vitals filed for this  visit.  Examination:  General exam: Appears calm and comfortable  Respiratory system: Some crackles at the bases, congested.  Respiratory effort normal. Cardiovascular system: S1 & S2 heard, RRR. No JVD, murmurs, rubs, gallops or clicks. No pedal edema. Gastrointestinal system: Abdomen is nondistended, soft and nontender. No organomegaly or masses felt. Normal bowel sounds heard. Central nervous system: Alert and oriented. No focal neurological deficits. Extremities: Symmetric 5 x 5 power. Skin: No rashes, lesions or ulcers.    Data Reviewed: I have personally reviewed following labs and imaging studies  CBC: Recent Labs  Lab 09/08/21 1930 09/09/21 0742 09/10/21 0359  WBC 8.8 7.2 6.0  NEUTROABS 7.7  --  4.2  HGB 14.9 13.1 12.4*  HCT 44.2 38.4* 36.5*  MCV 91.3 90.6 91.3  PLT 144* 126* 124*    Basic Metabolic Panel: Recent Labs  Lab 09/08/21 1930 09/09/21 0742 09/10/21 0359  NA 135 134* 137  K 3.9 3.9 3.3*  CL 100 105 106  CO2 24 20* 24  GLUCOSE 168* 83 91  BUN $Re'20 15 18  'swT$ CREATININE 1.19 0.81 0.94  CALCIUM 9.1 7.5* 8.2*    GFR: CrCl cannot be calculated (Unknown ideal weight.). Liver Function Tests: Recent Labs  Lab 09/08/21 1930 09/09/21 0742  AST 29 29  ALT 25 18  ALKPHOS 75 44  BILITOT 1.9* 1.2  PROT 7.3 4.7*  ALBUMIN 4.0 2.5*    No results for input(s): "LIPASE", "AMYLASE" in the last 168 hours. No results for input(s): "AMMONIA" in the last 168 hours. Coagulation Profile: Recent Labs  Lab 09/09/21 0742  INR 1.2    Cardiac Enzymes: No results for input(s): "CKTOTAL", "CKMB", "CKMBINDEX", "TROPONINI" in the last 168 hours. BNP (last 3 results) No results for input(s): "PROBNP" in the last 8760 hours. HbA1C: No results for input(s): "HGBA1C" in the last 72 hours. CBG: Recent Labs  Lab 09/08/21 2013  GLUCAP 125*    Lipid Profile: No results for input(s): "CHOL", "HDL", "LDLCALC", "TRIG", "CHOLHDL", "LDLDIRECT" in the last 72  hours. Thyroid Function Tests: No results for input(s): "TSH", "T4TOTAL", "FREET4", "T3FREE", "THYROIDAB" in the last 72 hours. Anemia Panel: Recent Labs    09/09/21 1010  FERRITIN 165   Sepsis Labs: Recent Labs  Lab 09/08/21 1930 09/09/21 0054 09/09/21 0742  PROCALCITON  --   --  <0.10  LATICACIDVEN 3.2* 0.8  --      Recent Results (from the past 240 hour(s))  Culture, blood (routine x 2)     Status: None (Preliminary result)   Collection Time: 09/08/21  7:30 PM   Specimen: BLOOD  Result Value Ref Range Status   Specimen Description   Final    BLOOD SPECIMEN SOURCE NOT MARKED ON REQUISITION Performed at [facility] [address], [place], [place] [address]    Special Requests   Final    BOTTLES DRAWN AEROBIC AND ANAEROBIC Blood Culture results may not be optimal due to an excessive volume of blood received in culture bottles Performed at [facility] [address], [place], [place] [address]    Culture   Final    NO GROWTH 2 DAYS Performed at [facility] [address], [place], [place] [address]    Report Status PENDING  Incomplete  Culture, blood (routine x 2)     Status: None (Preliminary result)   Collection Time: 09/08/21  7:30 PM   Specimen: BLOOD  Result Value Ref Range Status   Specimen Description   Final    BLOOD BLOOD RIGHT FOREARM Performed at [facility] [address], [place], [place] [address]    Special Requests   Final    BOTTLES DRAWN AEROBIC AND ANAEROBIC Blood Culture adequate volume Performed at [facility] [address] [place], [place] [address]    Culture   Final    NO GROWTH 2 DAYS Performed at [facility] [address], [place], [place] [address]    Report Status PENDING  Incomplete  Resp panel by RT-PCR (RSV, Flu A&B, Covid) Anterior Nasal Swab     Status: Abnormal   Collection Time: 09/08/21  7:48 PM   Specimen: Anterior Nasal  Swab  Result Value Ref Range Status   SARS Coronavirus 2 by RT PCR POSITIVE (A) NEGATIVE Final    Comment: (NOTE) SARS-CoV-2 target nucleic acids are DETECTED.  The SARS-CoV-2 RNA is generally detectable in upper respiratory specimens during the  acute phase of infection. Positive results are indicative of the presence of the identified virus, but do not rule out bacterial infection or co-infection with other pathogens not detected by the test. Clinical correlation with patient history and other diagnostic information is necessary to determine patient infection status. The expected result is Negative.  Fact Sheet for Patients: EntrepreneurPulse.com.au  Fact Sheet for Healthcare Providers: IncredibleEmployment.be  This test is not yet approved or cleared by the [place] FDA and  has been authorized for detection and/or diagnosis of SARS-CoV-2 by FDA under an Emergency Use Authorization (EUA).  This EUA will remain in effect (meaning this test can be used) for the duration of  the COVID-19 declaration under Section 564(b)(1) of the A ct, 21 U.S.C. section 360bbb-3(b)(1), unless the authorization is terminated or revoked sooner.     Influenza A by PCR NEGATIVE NEGATIVE Final   Influenza B by PCR NEGATIVE NEGATIVE Final    Comment: (NOTE) The Xpert Xpress SARS-CoV-2/FLU/RSV plus assay is intended as an aid in the diagnosis of influenza from Nasopharyngeal swab specimens and should not be used as a sole basis for treatment. Nasal washings and aspirates are unacceptable for Xpert Xpress SARS-CoV-2/FLU/RSV testing.  Fact Sheet for Patients: EntrepreneurPulse.com.au  Fact Sheet for Healthcare Providers: IncredibleEmployment.be  This test is not yet approved or cleared by the [place] FDA and has been authorized for detection and/or diagnosis of SARS-CoV-2 by FDA under an Emergency Use Authorization  (EUA). This EUA will remain in effect (meaning this test can be used) for the duration of the COVID-19 declaration under Section 564(b)(1) of the Act, 21 U.S.C. section 360bbb-3(b)(1), unless the authorization is terminated or revoked.     Resp Syncytial Virus by PCR NEGATIVE NEGATIVE Final    Comment: (NOTE) Fact Sheet for Patients: EntrepreneurPulse.com.au  Fact Sheet for Healthcare Providers: IncredibleEmployment.be  This test is not yet approved or cleared by the [place] FDA and has been authorized for detection and/or diagnosis of SARS-CoV-2 by FDA under an Emergency Use Authorization (EUA). This EUA will remain in effect (meaning this test can be used) for the duration of the COVID-19 declaration under Section 564(b)(1) of the Act, 21 U.S.C. section 360bbb-3(b)(1), unless the authorization is terminated or revoked.  Performed at [facility], [place] [address], [place], [place] [address]   Urine Culture     Status: None   Collection Time: 09/08/21 11:30 PM   Specimen: Urine, Clean Catch  Result Value Ref Range Status   Specimen Description   Final    URINE, CLEAN CATCH Performed at [facility], [place] [address] [place], [place] [address]    Special Requests   Final    NONE Performed at [facility], [place] [address] [place], [place] [address]    Culture   Final    NO GROWTH Performed at [facility], [place] [address], [place],  [address]    Report Status 09/10/2021 FINAL  Final     Radiology Studies: DG Chest Port 1 View  Result Date: 09/08/2021 CLINICAL DATA:  Cough, fever. EXAM: PORTABLE CHEST 1 VIEW COMPARISON:  No recent examination available for comparison. FINDINGS: The heart is normal in size. Prominent atherosclerotic calcification of the aortic arch. Lungs are clear without evidence of focal consolidation or pleural effusion. Mild elevation of the  left hemidiaphragm. Thoracic spondylosis. No acute osseous abnormality. IMPRESSION: No acute cardiopulmonary process. Electronically Signed   By: Judye Bos.O.  On: 09/08/2021 20:06    Scheduled Meds:  aspirin EC  81 mg Oral Daily   diltiazem  120 mg Oral Daily   enoxaparin (LOVENOX) injection  40 mg Subcutaneous Q24H   hydrochlorothiazide  12.5 mg Oral Daily   nirmatrelvir/ritonavir EUA  3 tablet Oral BID   Continuous Infusions:     LOS: 1 day   Darliss Cheney, MD [facility]  09/10/2021, 1:50 PM   *Please note that this is a verbal dictation therefore any spelling or grammatical errors are due to the "[employer]thel One" system interpretation.  Please page via [place] and do not message via secure chat for urgent patient care matters. Secure chat can be used for non urgent patient care matters.  How to contact the [facility] Attending or Consulting provider [place] or covering provider during after hours [place], for this patient?  Check the care team in [facility] and look for a) attending/consulting TRH provider listed and b) the [facility] team listed. Page or secure chat [phone]. Log into [email or web address] and use [facility]'s universal password to access. If you do not have the password, please contact the hospital operator. Locate the [facility] provider you are looking for under [facility] and page to a number that you can be directly reached. If you still have difficulty reaching the provider, please page the [facility] (Director on Call) for the Hospitalists listed on amion for assistance.

## 2021-09-10 NOTE — Evaluation (Signed)
Occupational Therapy Evaluation Patient Details Name: Gary Santiago MRN: [id number] DOB: [birth year] Today's Date: 09/10/2021   History of Present Illness Patient is 88 y.o. male with PMH significant of A.Fib, HTN and hyperlipidemia was brought in to the emergency department due to confusion, cough and fevers.  Was diagnosed with COVID-19 pneumonia.   Clinical Impression   Patient is currently requiring assistance with ADLs including min guard assist with Lower body ADLs, setup assist with Upper body ADLs,  as well as supervision assist with bed mobility and up to min guard assist with functional transfers to toilet.  Current level of function is below patient's typical baseline.  During this evaluation, patient was limited by generalized weakness, impaired activity tolerance, and baseline cognitive deficits, all of which has the potential to impact patient's safety and independence during functional mobility, as well as performance for ADLs.  Patient lives with his sister in an ALF, and endorses that he is tylically independent with BADLs and ambulation without h/o falls.  Patient demonstrates good rehab potential, and should benefit from continued skilled occupational therapy services while in acute care to maximize safety, independence and quality of life at home.  Continued occupational therapy services in the home is recommended.     Recommendations for follow up therapy are one component of a multi-disciplinary discharge planning process, led by the attending physician.  Recommendations may be updated based on patient status, additional functional criteria and insurance authorization.   Follow Up Recommendations  Home health OT    Assistance Recommended at Discharge Frequent or constant Supervision/Assistance  Patient can return home with the following A little help with bathing/dressing/bathroom;Assistance with cooking/housework;Direct supervision/assist for financial management;Direct  supervision/assist for medications management    Functional Status Assessment  Patient has had a recent decline in their functional status and demonstrates the ability to make significant improvements in function in a reasonable and predictable amount of time.  Equipment Recommendations  Other (comment) (Long handled adaptiev equipment for energy conservation)    Recommendations for Other Services       Precautions / Restrictions Precautions Precautions: Fall Precaution Comments: Airborn Restrictions Weight Bearing Restrictions: No      Mobility Bed Mobility Overal bed mobility: Needs Assistance Bed Mobility: Supine to Sit     Supine to sit: Supervision          Transfers                          Balance Overall balance assessment: Needs assistance   Sitting balance-Leahy Scale: Good     Standing balance support: Single extremity supported, No upper extremity supported Standing balance-Leahy Scale: Fair                             ADL either performed or assessed with clinical judgement   ADL Overall ADL's : Needs assistance/impaired Eating/Feeding: Independent;Bed level   Grooming: Sitting;Wash/dry hands;Set up   Upper Body Bathing: Sitting;Supervision/ safety;Set up;Cueing for safety Upper Body Bathing Details (indicate cue type and reason): Cues for rest breaks/pacing Lower Body Bathing: Minimal assistance;Sitting/lateral leans;Sit to/from stand   Upper Body Dressing : Min guard;Sitting;Set up   Lower Body Dressing: Minimal assistance;Supervision/safety Lower Body Dressing Details (indicate cue type and reason): Pt doffed and donned each sock while seated EOB with increased time/effort and Cues for rest breaks/pacing.Noted increased RR and desat to 87-88% per tele, and pt educated to rest and recover  during activities until breathing returns to nirmal. Toilet Transfer: Chief Financial Officer Details (indicate  cue type and reason): Pt tstood from EOB with supervision. Pt ambulated in room without AD and with Min guard to supervision. Pt returned to EOB per his preference. Toileting- Water quality scientist and Hygiene: Min guard;Sit to/from stand Toileting - Clothing Manipulation Details (indicate cue type and reason): Per general assessment.     Functional mobility during ADLs: Supervision/safety;Min guard       Vision Baseline Vision/History:  (reacders only) Ability to See in Adequate Light: 1 Impaired Additional Comments: Able to read clock with 5 min descrepancy     Perception     Praxis      Pertinent Vitals/Pain Pain Assessment Pain Assessment: No/denies pain     Hand Dominance Right   Extremity/Trunk Assessment Upper Extremity Assessment Upper Extremity Assessment: Overall WFL for tasks assessed   Lower Extremity Assessment Lower Extremity Assessment: Generalized weakness   Cervical / Trunk Assessment Cervical / Trunk Assessment: Normal   Communication Communication Communication: HOH   Cognition Arousal/Alertness: Awake/alert Behavior During Therapy: WFL for tasks assessed/performed Overall Cognitive Status: History of cognitive impairments - at baseline                                       General Comments       Exercises     Shoulder Instructions      Home Living Family/patient expects to be discharged to:: Skilled nursing facility Living Arrangements: Alone (ALF. PT reports living with his sister at the ALF, but remains confused) Available Help at Discharge: Available 24 hours/day;Other (Comment) Type of Home: Assisted living Home Access: Level entry     Home Layout: One level     Bathroom Shower/Tub: Walk-in shower;Tub/shower unit   Bathroom Toilet: Handicapped height Bathroom Accessibility: Yes   Home Equipment: None   Additional Comments: denies family in area but then reports his sister lives there also and they share an  apartment with a double bedroom.      Prior Functioning/Environment Prior Level of Function : Patient poor historian/Family not available (Pt reports being Independent with gait and all BADLs. Pt denied falls)                        OT Problem List: Cardiopulmonary status limiting activity;Decreased activity tolerance      OT Treatment/Interventions: Self-care/ADL training;Therapeutic activities;Energy conservation;DME and/or AE instruction;Patient/family education;Balance training    OT Goals(Current goals can be found in the care plan section) Acute Rehab OT Goals Patient Stated Goal: Go home OT Goal Formulation: With patient Time For Goal Achievement: 09/24/21 Potential to Achieve Goals: Good ADL Goals Pt Will Perform Lower Body Dressing: with modified independence (while following energy conservation principles) Pt Will Transfer to Toilet: with modified independence;ambulating Pt Will Perform Toileting - Clothing Manipulation and hygiene: with modified independence Pt Will Perform Tub/Shower Transfer: with modified independence;Shower transfer;grab bars (with calrification of pt's exact shower setup at ALF to simulate) Additional ADL Goal #1: Patient will identify at least 3 energy conservation strategies with assist from handout to compensate for cognitive deficits, to employ at home in order to maximize function and quality of life and decrease caregiver burden while preventing exacerbation of symptoms and rehospitalization.  OT Frequency: Min 2X/week    Co-evaluation  AM-PAC OT "6 Clicks" Daily Activity     Outcome Measure Help from another person eating meals?: None Help from another person taking care of personal grooming?: A Little Help from another person toileting, which includes using toliet, bedpan, or urinal?: A Little Help from another person bathing (including washing, rinsing, drying)?: A Little Help from another person to put on and  taking off regular upper body clothing?: A Little Help from another person to put on and taking off regular lower body clothing?: A Little 6 Click Score: 19   End of Session Equipment Utilized During Treatment: Gait belt Nurse Communication: Other (comment) (Rn cleared OT to see)  Activity Tolerance: Patient tolerated treatment well Patient left: with call bell/phone within reach;with bed alarm set (EOB)  OT Visit Diagnosis: Unsteadiness on feet (R26.81);Other symptoms and signs involving cognitive function                Time: 2993-7169 OT Time Calculation (min): 27 min Charges:  OT General Charges $OT Visit: 1 Visit OT Evaluation $OT Eval Low Complexity: 1 Low OT Treatments $Self Care/Home Management : 8-22 mins  Anderson Malta, OT Acute Rehab Services Office: [phone] 09/10/2021  Julien Girt 09/10/2021, 2:07 PM

## 2021-09-10 NOTE — TOC Initial Note (Signed)
Transition of Care [facility]) - Initial/Assessment Note    Patient Details  Name: Gary Santiago MRN: [id number] Date of Birth: 1925/11/20  Transition of Care [facility]) CM/SW Contact:    Leeroy Cha, RN Phone Number: 09/10/2021, 8:04 AM  Clinical Narrative:                  Transition of Care [facility]) Screening Note   Patient Details  Name: Gary Santiago Date of Birth: [birth year]   Transition of Care [facility]) CM/SW Contact:    Leeroy Cha, RN Phone Number: 09/10/2021, 8:04 AM    Transition of Care Department (TOC) has reviewed patient and no TOC needs have been identified at this time. We will continue to monitor patient advancement through interdisciplinary progression rounds. If new patient transition needs arise, please place a TOC consult.    Expected Discharge Plan: Home/Self Care Barriers to Discharge: Continued Medical Work up   Patient Goals and CMS Choice Patient states their goals for this hospitalization and ongoing recovery are:: to go home      Expected Discharge Plan and Services Expected Discharge Plan: Home/Self Care   Discharge Planning Services: CM Consult   Living arrangements for the past 2 months: Single Family Home                                      Prior Living Arrangements/Services Living arrangements for the past 2 months: Single Family Home Lives with:: Self Patient language and need for interpreter reviewed:: Yes Do you feel safe going back to the place where you live?: Yes            Criminal Activity/Legal Involvement Pertinent to Current Situation/Hospitalization: No - Comment as needed  Activities of Daily Living Home Assistive Devices/Equipment: Other (Comment) (pt unable to verbalize what equipment he uses) ADL Screening (condition at time of admission) Patient's cognitive ability adequate to safely complete daily activities?: No Is the patient deaf or have difficulty hearing?: No Does the patient have  difficulty seeing, even when wearing glasses/contacts?: No Does the patient have difficulty concentrating, remembering, or making decisions?: Yes Patient able to express need for assistance with ADLs?: Yes Does the patient have difficulty dressing or bathing?: Yes Independently performs ADLs?: No Communication: Independent Dressing (OT): Needs assistance Is this a change from baseline?: Pre-admission baseline Grooming: Independent Feeding: Independent Bathing: Needs assistance Is this a change from baseline?: Pre-admission baseline Toileting: Needs assistance Is this a change from baseline?: Pre-admission baseline In/Out Bed: Needs assistance Is this a change from baseline?: Pre-admission baseline Walks in Home: Needs assistance Is this a change from baseline?: Pre-admission baseline Does the patient have difficulty walking or climbing stairs?: Yes Weakness of Legs: Both Weakness of Arms/Hands: Both  Permission Sought/Granted                  Emotional Assessment Appearance:: Appears stated age     Orientation: : Oriented to Self, Oriented to Place, Oriented to  Time, Oriented to Situation Alcohol / Substance Use: Not Applicable Psych Involvement: No (comment)  Admission diagnosis:  Confusion [R41.0] Febrile illness [R50.9] Sepsis ([place]) [A41.9] Acute cough [R05.1] Patient Active Problem List   Diagnosis Date Noted   Sepsis ([place]) 09/09/2021   Severe sepsis ([place]) 09/08/2021   HTN (hypertension) 09/08/2021   Atrial fibrillation ([place]) 02/08/2015   Cyst near tailbone 02/02/2015   Sebaceous cyst 01/29/2015   PCP:  System, Provider Not In Pharmacy:   [place], [place] [address] Phone: [phone] Fax: [phone]     Social Determinants of Health (SDOH) Interventions    Readmission Risk Interventions     No data to display

## 2021-09-11 ENCOUNTER — Other Ambulatory Visit (HOSPITAL_COMMUNITY): Payer: Self-pay

## 2021-09-11 DIAGNOSIS — R6520 Severe sepsis without septic shock: Secondary | ICD-10-CM | POA: Diagnosis not present

## 2021-09-11 DIAGNOSIS — A419 Sepsis, unspecified organism: Secondary | ICD-10-CM | POA: Diagnosis not present

## 2021-09-11 MED ORDER — DILTIAZEM HCL ER COATED BEADS 120 MG PO CP24
120.0000 mg | ORAL_CAPSULE | Freq: Every day | ORAL | 0 refills | Status: DC
  Filled 2021-09-11: qty 6, 6d supply, fill #0

## 2021-09-11 MED ORDER — SIMVASTATIN 20 MG PO TABS: 20.0000 mg | ORAL_TABLET | Freq: Every day | ORAL | Status: AC

## 2021-09-11 MED ORDER — SIMVASTATIN 20 MG PO TABS: 20.0000 mg | ORAL_TABLET | Freq: Every day | ORAL | Status: DC

## 2021-09-11 MED ORDER — DILTIAZEM HCL ER COATED BEADS 240 MG PO CP24
240.0000 mg | ORAL_CAPSULE | Freq: Every day | ORAL | Status: AC
Start: 2021-09-18 — End: ?

## 2021-09-11 MED ORDER — TAMSULOSIN HCL 0.4 MG PO CAPS: 0.4000 mg | ORAL_CAPSULE | Freq: Every day | ORAL | Status: AC

## 2021-09-11 MED ORDER — DILTIAZEM HCL ER COATED BEADS 240 MG PO CP24: 240.0000 mg | ORAL_CAPSULE | Freq: Every day | ORAL | Status: DC

## 2021-09-11 MED ORDER — DILTIAZEM HCL ER COATED BEADS 120 MG PO CP24: 120.0000 mg | ORAL_CAPSULE | Freq: Every day | ORAL | 0 refills | Status: AC

## 2021-09-11 NOTE — TOC Transition Note (Addendum)
Transition of Care [facility]) - CM/SW Discharge Note   Patient Details  Name: TRAYLEN ECKELS MRN: [id number] Date of Birth: [birth year]  Transition of Care [facility]) CM/SW Contact:  Leeroy Cha, RN Phone Number: 09/11/2021, 11:36 AM   Clinical Narrative:    [facility] will check patient status and call back informed that he will need hhc pt and ot.     Barriers to Discharge: Continued Medical Work up   Patient Goals and CMS Choice Patient states their goals for this hospitalization and ongoing recovery are:: to go home      Discharge Placement                       Discharge Plan and Services   Discharge Planning Services: CM Consult                                 Social Determinants of Health (SDOH) Interventions     Readmission Risk Interventions     No data to display

## 2021-09-11 NOTE — Progress Notes (Signed)
Physical Therapy Treatment Patient Details Name: Gary Santiago MRN: [id number] DOB: 11/28/1925 Today's Date: 09/11/2021   History of Present Illness Patient is 88 y.o. male with PMH significant of A.Fib, HTN and hyperlipidemia was brought in to the emergency department due to confusion, cough and fevers.  Was diagnosed with COVID-19 pneumonia.    PT Comments    Pt AxO x 3 very pleasant.  Assisted OOB to amb went well.  General transfer comment: pt self able to rise from bed as well as from lower height toilet.  Also self able to perform peri care safely woth no LOB and good use of B UE's to steady self. General Gait Details: pt self able to amb to and from bathroom with NO assist and NO AD.   Tolerated well.  RA avg 91% and HR 88. Pt plans to return to Indep living at [facility].    Recommendations for follow up therapy are one component of a multi-disciplinary discharge planning process, led by the attending physician.  Recommendations may be updated based on patient status, additional functional criteria and insurance authorization.  Follow Up Recommendations  Home health PT     Assistance Recommended at Discharge Frequent or constant Supervision/Assistance  Patient can return home with the following A little help with walking and/or transfers;A little help with bathing/dressing/bathroom;Assistance with cooking/housework;Direct supervision/assist for medications management;Assist for transportation;Help with stairs or ramp for entrance;Direct supervision/assist for financial management   Equipment Recommendations  None recommended by PT    Recommendations for Other Services       Precautions / Restrictions Precautions Precautions: Fall Precaution Comments: Airborn Restrictions Weight Bearing Restrictions: No     Mobility  Bed Mobility Overal bed mobility: Modified Independent             General bed mobility comments: self able with increased time     Transfers Overall transfer level: Modified independent Equipment used: 1 person hand held assist Transfers: Sit to/from Stand Sit to Stand: Supervision           General transfer comment: pt self able to rise from bed as well as from lower height toilet.  Also self able to perform peri care safely woth no LOB and good use of B UE's to steady self.    Ambulation/Gait Ambulation/Gait assistance: Supervision Gait Distance (Feet): 24 Feet Assistive device: 1 person hand held assist Gait Pattern/deviations: Step-through pattern, Trunk flexed Gait velocity: decr     General Gait Details: pt self able to amb to and from bathroom with NO assist and NO AD.   Tolerated well.  RA avg 91% and HR 88.   Stairs             Wheelchair Mobility    Modified Rankin (Stroke Patients Only)       Balance                                            Cognition Arousal/Alertness: Awake/alert Behavior During Therapy: WFL for tasks assessed/performed Overall Cognitive Status: Within Functional Limits for tasks assessed                                 General Comments: AxO x 3 very pleasant and motivated        Exercises      General  Comments        Pertinent Vitals/Pain Pain Assessment Pain Assessment: No/denies pain    Home Living                          Prior Function            PT Goals (current goals can now be found in the care plan section) Progress towards PT goals: Progressing toward goals    Frequency    Min 3X/week      PT Plan Current plan remains appropriate    Co-evaluation              AM-PAC PT "6 Clicks" Mobility   Outcome Measure  Help needed turning from your back to your side while in a flat bed without using bedrails?: None Help needed moving from lying on your back to sitting on the side of a flat bed without using bedrails?: None Help needed moving to and from a bed to a chair  (including a wheelchair)?: None Help needed standing up from a chair using your arms (e.g., wheelchair or bedside chair)?: A Little Help needed to walk in hospital room?: A Little Help needed climbing 3-5 steps with a railing? : A Little 6 Click Score: 21    End of Session Equipment Utilized During Treatment: Gait belt Activity Tolerance: Patient tolerated treatment well Patient left: in bed;with call bell/phone within reach Nurse Communication: Mobility status PT Visit Diagnosis: Unsteadiness on feet (R26.81);Muscle weakness (generalized) (M62.81);Difficulty in walking, not elsewhere classified (R26.2)     Time: 3300-7622 PT Time Calculation (min) (ACUTE ONLY): 15 min  Charges:  $Gait Training: 8-22 mins                     {Shital Crayton  PTA Acute  [employer] M-F          [phone] Weekend pager [phone]

## 2021-09-11 NOTE — Discharge Summary (Signed)
Physician Discharge Summary  TAYON PAREKH BSJ:[id number] DOB: [birth year] DOA: 09/08/2021  PCP: System, Provider Not In  Admit date: 09/08/2021 Discharge date: 09/11/2021  Admitted From: home ([facility]) Discharge disposition: home   Recommendations for Outpatient Follow-Up:   Medications adjusted while on paxlovid (flomax/statin/cardizem) Cbc/bmp 1 week   Discharge Diagnosis:   Principal Problem:   Severe sepsis ([place]) Active Problems:   Atrial fibrillation ([place])   HTN (hypertension)   Sepsis ([place])   Pressure injury of skin    Discharge Condition: Improved.  Diet recommendation: Low sodium, heart healthy.  Wound care: None.  Code status: Full.   History of Present Illness:   Gary Santiago is a 88 y.o. male with medical history significant of A.Fib, HTN and hyperlipidemia was brought in to the emergency department due to confusion, cough and fevers.  Was diagnosed with COVID-19 pneumonia.   Hospital Course by Problem:   Severe sepsis secondary to COVID-19 pneumonia, POA: Patient met criteria for severe sepsis based on fever of 101.6, tachypnea and lactic acid of 3.2.  Source likely COVID-19 pneumonia.  Procalcitonin within normal range, arguing against bacterial pneumonia and thus I will discontinue antibiotics.  CRP pending, D-dimer minimally elevated which is normal for his age adjusted.  No suspicion of PE.  Discussed with POA Dr. Hilarie Fredrickson, he consented and thus we started on Paxlovid.  Per pharmacy, there were some interactions with the medication so her diltiazem was reduced in dose -no O2 need   Permanent atrial fibrillation: Rates controlled.  Continue Cardizem.  He is not on any anticoagulation, likely due to his age.   Acute toxic encephalopathy: Likely in the setting of severe sepsis with COVID-pneumonia.  Patient has remained fully alert and oriented since yesterday. -very hard of hearing   Hyperlipidemia: Resumed Zocor- 3 days after finish  paxlovid   BPH: Resume Flomax- 3 days after finish paxlovid   Hypertension: Resume hydrochlorothiazide.   Acute thrombocytopenia: Likely in the setting of sepsis.  No signs of bleeding.  Monitor.   Elevated troponin: Elevated but flat, indicating demand ischemia.  No signs or symptoms of ACS.      Medical Consultants:      Discharge Exam:   Vitals:   09/11/21 0602 09/11/21 0949  BP: (!) 145/70 (!) 148/59  Pulse: 67   Resp: 18   Temp: 98.5 F (36.9 C)   SpO2: 92%    Vitals:   09/10/21 2013 09/11/21 0500 09/11/21 0602 09/11/21 0949  BP: (!) 151/57  (!) 145/70 (!) 148/59  Pulse: 64  67   Resp: 20  18   Temp: (!) 97.5 F (36.4 C)  98.5 F (36.9 C)   TempSrc:      SpO2: 100%  92%   Weight:  74.2 kg    Height:  _0  (1.803 m)      General exam: Appears calm and comfortable.  .    The results of significant diagnostics from this hospitalization (including imaging, microbiology, ancillary and laboratory) are listed below for reference.     Procedures and Diagnostic Studies:   DG Chest Port 1 View  Result Date: 09/08/2021 CLINICAL DATA:  Cough, fever. EXAM: PORTABLE CHEST 1 VIEW COMPARISON:  No recent examination available for comparison. FINDINGS: The heart is normal in size. Prominent atherosclerotic calcification of the aortic arch. Lungs are clear without evidence of focal consolidation or pleural effusion. Mild elevation of the left hemidiaphragm. Thoracic spondylosis. No acute osseous abnormality. IMPRESSION: No acute  cardiopulmonary process. Electronically Signed   By: Keane Police D.O.   On: 09/08/2021 20:06     Labs:   Basic Metabolic Panel: Recent Labs  Lab 09/08/21 1930 09/09/21 0742 09/10/21 0359  NA 135 134* 137  K 3.9 3.9 3.3*  CL 100 105 106  CO2 24 20* 24  GLUCOSE 168* 83 91  BUN _0 CREATININE 1.19 0.81 0.94  CALCIUM 9.1 7.5* 8.2*   GFR Estimated Creatinine Clearance: 50.4 mL/min (by C-G formula based on SCr of 0.94  mg/dL). Liver Function Tests: Recent Labs  Lab 09/08/21 1930 09/09/21 0742  AST 29 29  ALT 25 18  ALKPHOS 75 44  BILITOT 1.9* 1.2  PROT 7.3 4.7*  ALBUMIN 4.0 2.5*   No results for input(s): "LIPASE", "AMYLASE" in the last 168 hours. No results for input(s): "AMMONIA" in the last 168 hours. Coagulation profile Recent Labs  Lab 09/09/21 0742  INR 1.2    CBC: Recent Labs  Lab 09/08/21 1930 09/09/21 0742 09/10/21 0359  WBC 8.8 7.2 6.0  NEUTROABS 7.7  --  4.2  HGB 14.9 13.1 12.4*  HCT 44.2 38.4* 36.5*  MCV 91.3 90.6 91.3  PLT 144* 126* 124*   Cardiac Enzymes: No results for input(s): "CKTOTAL", "CKMB", "CKMBINDEX", "TROPONINI" in the last 168 hours. BNP: Invalid input(s): "POCBNP" CBG: Recent Labs  Lab 09/08/21 2013  GLUCAP 125*   D-Dimer Recent Labs    09/09/21 1010  DDIMER 0.64*   Hgb A1c No results for input(s): "HGBA1C" in the last 72 hours. Lipid Profile No results for input(s): "CHOL", "HDL", "LDLCALC", "TRIG", "CHOLHDL", "LDLDIRECT" in the last 72 hours. Thyroid function studies No results for input(s): "TSH", "T4TOTAL", "T3FREE", "THYROIDAB" in the last 72 hours.  Invalid input(s): "FREET3" Anemia work up Recent Labs    09/09/21 1010  FERRITIN 165   Microbiology Recent Results (from the past 240 hour(s))  Culture, blood (routine x 2)     Status: None (Preliminary result)   Collection Time: 09/08/21  7:30 PM   Specimen: BLOOD  Result Value Ref Range Status   Specimen Description   Final    BLOOD SPECIMEN SOURCE NOT MARKED ON REQUISITION Performed at [facility], [place] [address] [place], [place] [address]    Special Requests   Final    BOTTLES DRAWN AEROBIC AND ANAEROBIC Blood Culture results may not be optimal due to an excessive volume of blood received in culture bottles Performed at [place] [address], [place], [place] [address]    Culture   Final    NO GROWTH 3 DAYS Performed at  [facility], [place] [address] [place], [place] [address]    Report Status PENDING  Incomplete  Culture, blood (routine x 2)     Status: None (Preliminary result)   Collection Time: 09/08/21  7:30 PM   Specimen: BLOOD  Result Value Ref Range Status   Specimen Description   Final    BLOOD BLOOD RIGHT FOREARM Performed at [facility] [address] [place], [place] [address]    Special Requests   Final    BOTTLES DRAWN AEROBIC AND ANAEROBIC Blood Culture adequate volume Performed at [facility] [address], [place], [place] [address]    Culture   Final    NO GROWTH 3 DAYS Performed at [facility], [place] [address], [place], [place] [address]    Report Status PENDING  Incomplete  Resp panel by RT-PCR (RSV, Flu A&B, Covid) Anterior Nasal Swab     Status: Abnormal   Collection Time: 09/08/21  7:48 PM   Specimen: Anterior Nasal Swab  Result Value Ref Range Status   SARS Coronavirus 2 by RT PCR POSITIVE (A) NEGATIVE Final    Comment: (NOTE) SARS-CoV-2 target nucleic acids are DETECTED.  The SARS-CoV-2 RNA is generally detectable in upper respiratory specimens during the acute phase of infection. Positive results are indicative of the presence of the identified virus, but do not rule out bacterial infection or co-infection with other pathogens not detected by the test. Clinical correlation with patient history and other diagnostic information is necessary to determine patient infection status. The expected result is Negative.  Fact Sheet for Patients: EntrepreneurPulse.com.au  Fact Sheet for Healthcare Providers: IncredibleEmployment.be  This test is not yet approved or cleared by the [place] FDA and  has been authorized for detection and/or diagnosis of SARS-CoV-2 by FDA under an Emergency Use Authorization (EUA).  This EUA will remain in effect (meaning this test can be used) for  the duration of  the COVID-19 declaration under Section 564(b)(1) of the A ct, 21 U.S.C. section 360bbb-3(b)(1), unless the authorization is terminated or revoked sooner.     Influenza A by PCR NEGATIVE NEGATIVE Final   Influenza B by PCR NEGATIVE NEGATIVE Final    Comment: (NOTE) The Xpert Xpress SARS-CoV-2/FLU/RSV plus assay is intended as an aid in the diagnosis of influenza from Nasopharyngeal swab specimens and should not be used as a sole basis for treatment. Nasal washings and aspirates are unacceptable for Xpert Xpress SARS-CoV-2/FLU/RSV testing.  Fact Sheet for Patients: EntrepreneurPulse.com.au  Fact Sheet for Healthcare Providers: IncredibleEmployment.be  This test is not yet approved or cleared by the [place] FDA and has been authorized for detection and/or diagnosis of SARS-CoV-2 by FDA under an Emergency Use Authorization (EUA). This EUA will remain in effect (meaning this test can be used) for the duration of the COVID-19 declaration under Section 564(b)(1) of the Act, 21 U.S.C. section 360bbb-3(b)(1), unless the authorization is terminated or revoked.     Resp Syncytial Virus by PCR NEGATIVE NEGATIVE Final    Comment: (NOTE) Fact Sheet for Patients: EntrepreneurPulse.com.au  Fact Sheet for Healthcare Providers: IncredibleEmployment.be  This test is not yet approved or cleared by the [place] FDA and has been authorized for detection and/or diagnosis of SARS-CoV-2 by FDA under an Emergency Use Authorization (EUA). This EUA will remain in effect (meaning this test can be used) for the duration of the COVID-19 declaration under Section 564(b)(1) of the Act, 21 U.S.C. section 360bbb-3(b)(1), unless the authorization is terminated or revoked.  Performed at [facility], [place] [address], [place], [place] [address]   Urine Culture     Status: None   Collection  Time: 09/08/21 11:30 PM   Specimen: Urine, Clean Catch  Result Value Ref Range Status   Specimen Description   Final    URINE, CLEAN CATCH Performed at [facility], [place] [address], [place], [place] [address]    Special Requests   Final    NONE Performed at [facility], [place] [address], [place], [place] [address]    Culture   Final    NO GROWTH Performed at [facility], [place] [address] [place], [place] [address]    Report Status 09/10/2021 FINAL  Final     Discharge Instructions:   Discharge Instructions  Diet - low sodium heart healthy   Complete by: As directed    Increase activity slowly   Complete by: As directed    No wound care   Complete by: As directed       Allergies as of 09/11/2021   No Known Allergies      Medication List     TAKE these medications    aspirin 81 MG tablet Take 81 mg by mouth daily.   diltiazem 240 MG 24 hr capsule Commonly known as: CARDIZEM CD Take 240 mg by mouth daily.   hydrochlorothiazide 12.5 MG tablet Commonly known as: HYDRODIURIL Take 12.5 mg by mouth daily.   multivitamin tablet Take 1 tablet by mouth daily.   nirmatrelvir/ritonavir EUA 20 x 150 MG & 10 x 100MG Tabs Commonly known as: PAXLOVID Take 3 tablets by mouth 2 (two) times daily for 5 doses. Patient GFR is >60. Take nirmatrelvir (150 mg) two tablets twice daily for 5 days and ritonavir (100 mg) one tablet twice daily for 5 days.   simvastatin 20 MG tablet Commonly known as: ZOCOR Take 1 tablet (20 mg total) by mouth daily. Start taking on: September 16, 2021 What changed: These instructions start on September 16, 2021. If you are unsure what to do until then, ask your doctor or other care provider.   tamsulosin 0.4 MG Caps capsule Commonly known as: FLOMAX Take 0.4 mg by mouth at bedtime.   VITAMIN D-3 PO Take 1,000 Units by mouth daily.          Time coordinating discharge: 45 min  Signed:  Geradine Girt DO  [facility] 09/11/2021, 12:58 PM

## 2021-09-11 NOTE — Progress Notes (Signed)
Discharge paperwork provided in packet for receiving facility. [facility] aware of pt's discharge. IV removed intact.  Gary Santiago

## 2021-09-11 NOTE — Progress Notes (Signed)
SATURATION QUALIFICATIONS: (This note is used to comply with regulatory documentation for home oxygen)  Patient Saturations on Room Air at Rest = 97%  Patient Saturations on Room Air while Ambulating = 96%  Patient Saturations on 0 Liters of oxygen while Ambulating = 93%  Jerene Pitch

## 2021-09-13 LAB — CULTURE, BLOOD (ROUTINE X 2)
Culture: NO GROWTH
Culture: NO GROWTH
Special Requests: ADEQUATE

## 2022-02-19 ENCOUNTER — Emergency Department (HOSPITAL_COMMUNITY): Payer: Medicare Other

## 2022-02-19 ENCOUNTER — Emergency Department (HOSPITAL_COMMUNITY)
Admission: EM | Admit: 2022-02-19 | Discharge: 2022-02-19 | Disposition: A | Discharge status: Skilled Nursing Facility | Payer: Medicare Other | Attending: Emergency Medicine | Admitting: Emergency Medicine

## 2022-02-19 DIAGNOSIS — Z7982 Long term (current) use of aspirin: Secondary | ICD-10-CM | POA: Insufficient documentation

## 2022-02-19 DIAGNOSIS — R0609 Other forms of dyspnea: Secondary | ICD-10-CM

## 2022-02-19 DIAGNOSIS — W19XXXA Unspecified fall, initial encounter: Secondary | ICD-10-CM | POA: Insufficient documentation

## 2022-02-19 DIAGNOSIS — R0602 Shortness of breath: Secondary | ICD-10-CM | POA: Diagnosis not present

## 2022-02-19 DIAGNOSIS — Z1152 Encounter for screening for COVID-19: Secondary | ICD-10-CM | POA: Diagnosis not present

## 2022-02-19 DIAGNOSIS — F0390 Unspecified dementia without behavioral disturbance: Secondary | ICD-10-CM | POA: Diagnosis not present

## 2022-02-19 DIAGNOSIS — Z79899 Other long term (current) drug therapy: Secondary | ICD-10-CM | POA: Insufficient documentation

## 2022-02-19 DIAGNOSIS — I10 Essential (primary) hypertension: Secondary | ICD-10-CM | POA: Diagnosis not present

## 2022-02-19 DIAGNOSIS — R600 Localized edema: Secondary | ICD-10-CM | POA: Insufficient documentation

## 2022-02-19 LAB — CBC WITH DIFFERENTIAL/PLATELET
Abs Immature Granulocytes: 0.02 10*3/uL (ref 0.00–0.07)
Basophils Absolute: 0.1 10*3/uL (ref 0.0–0.1)
Basophils Relative: 1 %
Eosinophils Absolute: 0.2 10*3/uL (ref 0.0–0.5)
Eosinophils Relative: 3 %
HCT: 48.7 % (ref 39.0–52.0)
Hemoglobin: 16.6 g/dL (ref 13.0–17.0)
Immature Granulocytes: 0 %
Lymphocytes Relative: 16 %
Lymphs Abs: 1.2 10*3/uL (ref 0.7–4.0)
MCH: 31.5 pg (ref 26.0–34.0)
MCHC: 34.1 g/dL (ref 30.0–36.0)
MCV: 92.4 fL (ref 80.0–100.0)
Monocytes Absolute: 0.4 10*3/uL (ref 0.1–1.0)
Monocytes Relative: 5 %
Neutro Abs: 5.9 10*3/uL (ref 1.7–7.7)
Neutrophils Relative %: 75 %
Platelets: 181 10*3/uL (ref 150–400)
RBC: 5.27 MIL/uL (ref 4.22–5.81)
RDW: 13 % (ref 11.5–15.5)
WBC: 7.8 10*3/uL (ref 4.0–10.5)
nRBC: 0 % (ref 0.0–0.2)

## 2022-02-19 LAB — URINALYSIS, ROUTINE W REFLEX MICROSCOPIC
Bilirubin Urine: NEGATIVE
Glucose, UA: NEGATIVE mg/dL
Hgb urine dipstick: NEGATIVE
Ketones, ur: NEGATIVE mg/dL
Leukocytes,Ua: NEGATIVE
Nitrite: NEGATIVE
Protein, ur: 30 mg/dL — AB
Specific Gravity, Urine: 1.018 (ref 1.005–1.030)
pH: 5 (ref 5.0–8.0)

## 2022-02-19 LAB — COMPREHENSIVE METABOLIC PANEL WITH GFR
ALT: 21 U/L (ref 0–44)
AST: 26 U/L (ref 15–41)
Albumin: 3.7 g/dL (ref 3.5–5.0)
Alkaline Phosphatase: 75 U/L (ref 38–126)
Anion gap: 12 (ref 5–15)
BUN: 10 mg/dL (ref 8–23)
CO2: 23 mmol/L (ref 22–32)
Calcium: 8.8 mg/dL — ABNORMAL LOW (ref 8.9–10.3)
Chloride: 102 mmol/L (ref 98–111)
Creatinine, Ser: 1.16 mg/dL (ref 0.61–1.24)
GFR, Estimated: 58 mL/min — ABNORMAL LOW (ref >60)
Glucose, Bld: 183 mg/dL — ABNORMAL HIGH (ref 70–99)
Potassium: 3.8 mmol/L (ref 3.5–5.1)
Sodium: 137 mmol/L (ref 135–145)
Total Bilirubin: 1.4 mg/dL — ABNORMAL HIGH (ref 0.3–1.2)
Total Protein: 6.6 g/dL (ref 6.5–8.1)

## 2022-02-19 LAB — RESP PANEL BY RT-PCR (RSV, FLU A&B, COVID)  RVPGX2
Influenza A by PCR: NEGATIVE
Influenza B by PCR: NEGATIVE
Resp Syncytial Virus by PCR: NEGATIVE
SARS Coronavirus 2 by RT PCR: NEGATIVE

## 2022-02-19 NOTE — ED Provider Triage Note (Signed)
Emergency Medicine Provider Triage Evaluation Note  Gary Santiago , a 89 y.o. male  was evaluated in triage.  Pt complains of fall.  Patient reports that he stumbled across his feet last night and he fell hitting the front of his head on the ground.  He denies any loss of consciousness.  Per wife and power of attorney, Gary Santiago, patient appears to be more confused than his typical for his baseline.  He denies nausea, vomiting, abdominal pain, diarrhea, chest pain, shortness of breath.  Review of Systems  Positive: As above Negative: As above  Physical Exam  There were no vitals taken for this visit. Gen:   Awake, no distress, hard of hearing and some level of confusion.  AO x 3 Resp:  Normal effort  MSK:   Moves extremities without difficulty  Other:    Medical Decision Making  Medically screening exam initiated at 2:09 PM.  Appropriate orders placed.  Gary Santiago was informed that the remainder of the evaluation will be completed by another provider, this initial triage assessment does not replace that evaluation, and the importance of remaining in the ED until their evaluation is complete.     Luvenia Heller, PA-C 02/19/22 [phone]

## 2022-02-19 NOTE — ED Triage Notes (Addendum)
Patient BIB [facility] from [facility] for evaluation of a fall that occurred earlier today and exertional shortness of breath that started a few days ago. Denies LOC, is alert, oriented, and in no apparent distress at this time. SpO2 95% on room air with RR 30, placed on 3L O2 [place].   EMS Vitals HR 103 RR 26 99% on 3L [place]

## 2022-02-19 NOTE — Discharge Instructions (Signed)
You were seen in the emergency department for your fall as well as your shortness of breath.  Your workup showed no signs of injuries from your fall and no fluid on your lungs, no abnormal heart rhythms and your heart appeared in normal size.  You tested negative for COVID, flu and RSV.  Your oxygen levels have been normal since you have been in the emergency department.  He is unclear the cause of your shortness of breath at this time, however you can follow-up with your primary doctor for continued outpatient workup as well as for reevaluation of your head.  You should return to the emergency department if you are having significantly worsening headaches, repetitive vomiting, numbness or weakness on one side of the body compared to the other, worsening shortness of breath, severe chest pain or if you have any other new or concerning symptoms.

## 2022-02-19 NOTE — ED Provider Notes (Signed)
[facility] EMERGENCY DEPARTMENT Provider Note   CSN: [id number] Arrival date & time: 02/19/22  1342     History  Chief Complaint  Patient presents with   Gary Santiago is a 89 y.o. male.  Patient is a 89 year old male with a past medical history of dementia, A-fib not on anticoagulation and hypertension presenting to the emergency department after a fall.  The patient states that he slipped and fell forward landing on his forehead.  He denies any loss of consciousness.  He states he was able to get up and ambulate after the fall.  The patient is here with his granddaughter who is also his POA who states that he has also had dyspnea on exertion over the last couple of weeks and had a desaturation event during physical therapy.  She states that he has started outpatient workup with his primary doctor.  Patient denies any current chest pain or shortness of breath.  He denies any nausea or vomiting, numbness or weakness.  The history is provided by the patient and a relative.  Fall       Home Medications Prior to Admission medications   Medication Sig Start Date End Date Taking? Authorizing Provider  aspirin 81 MG tablet Take 81 mg by mouth daily.    [provider]  Cholecalciferol (VITAMIN D-3 PO) Take 1,000 Units by mouth daily.    [provider]  diltiazem (CARDIZEM CD) 120 MG 24 hr capsule Take 1 capsule (120 mg total) by mouth daily for 6 days. 09/12/21 09/18/21  Geradine Girt, DO  diltiazem (CARDIZEM CD) 240 MG 24 hr capsule Take 1 capsule (240 mg total) by mouth daily. 09/18/21   Geradine Girt, DO  hydrochlorothiazide (HYDRODIURIL) 12.5 MG tablet Take 12.5 mg by mouth daily. 08/29/21   [provider]  Multiple Vitamin (MULTIVITAMIN) tablet Take 1 tablet by mouth daily.    [provider]  simvastatin (ZOCOR) 20 MG tablet Take 1 tablet (20 mg total) by mouth daily. 09/18/21   Geradine Girt, DO  tamsulosin  (FLOMAX) 0.4 MG CAPS capsule Take 1 capsule (0.4 mg total) by mouth at bedtime. 09/18/21   Geradine Girt, DO      Allergies    Patient has no known allergies.    Review of Systems   Review of Systems  Physical Exam Updated Vital Signs BP (!) 164/77   Pulse 88   Temp 98.9 F (37.2 C) (Oral)   Resp 18   SpO2 99%  Physical Exam Vitals and nursing note reviewed.  Constitutional:      General: He is not in acute distress.    Appearance: Normal appearance.  HENT:     Head: Normocephalic and atraumatic.     Nose: Nose normal.     Mouth/Throat:     Mouth: Mucous membranes are moist.     Pharynx: Oropharynx is clear.  Eyes:     Extraocular Movements: Extraocular movements intact.     Conjunctiva/sclera: Conjunctivae normal.     Pupils: Pupils are equal, round, and reactive to light.  Neck:     Comments: No midline neck tenderness Cardiovascular:     Rate and Rhythm: Normal rate and regular rhythm.     Heart sounds: Normal heart sounds.  Pulmonary:     Effort: Pulmonary effort is normal.     Breath sounds: Normal breath sounds.  Abdominal:     General: Abdomen is flat.  Palpations: Abdomen is soft.     Tenderness: There is no abdominal tenderness.  Musculoskeletal:        General: Normal range of motion.     Cervical back: Normal range of motion and neck supple.     Right lower leg: Edema (1+ to shins with chronic overlying skin changes) present.     Left lower leg: Edema (1+ to shins with chronic overlying skin changes) present.     Comments: No midline back tenderness No tenderness to palpation of bilateral upper or lower extremities  Neurological:     General: No focal deficit present.     Mental Status: He is alert and oriented to person, place, and time.     Sensory: No sensory deficit.     Motor: No weakness.  Psychiatric:        Mood and Affect: Mood normal.        Behavior: Behavior normal.     ED Results / Procedures / Treatments   Labs (all labs  ordered are listed, but only abnormal results are displayed) Labs Reviewed  COMPREHENSIVE METABOLIC PANEL - Abnormal; Notable for the following components:      Result Value   Glucose, Bld 183 (*)    Calcium 8.8 (*)    Total Bilirubin 1.4 (*)    GFR, Estimated 58 (*)    All other components within normal limits  RESP PANEL BY RT-PCR (RSV, FLU A&B, COVID)  RVPGX2  CBC WITH DIFFERENTIAL/PLATELET  URINALYSIS, ROUTINE W REFLEX MICROSCOPIC    EKG EKG Interpretation  Date/Time:  Wednesday February 19 2022 14:09:09 EST Ventricular Rate:  98 PR Interval:  160 QRS Duration: 126 QT Interval:  388 QTC Calculation: 495 R Axis:   245 Text Interpretation: Suspect arm lead reversal, interpretation assumes no reversal Sinus rhythm with Fusion complexes Right bundle branch block Inferior infarct , age undetermined Abnormal ECG No significant change since last tracing Confirmed by Leanord Asal ([id number]) on 02/19/2022 6:31:59 PM  Radiology CT Head Wo Contrast  Result Date: 02/19/2022 CLINICAL DATA:  Head trauma, minor (Age >= 65y) Fall last night hitting head on ground. EXAM: CT HEAD WITHOUT CONTRAST TECHNIQUE: Contiguous axial images were obtained from the base of the skull through the vertex without intravenous contrast. RADIATION DOSE REDUCTION: This exam was performed according to the departmental dose-optimization program which includes automated exposure control, adjustment of the mA and/or kV according to patient size and/or use of iterative reconstruction technique. COMPARISON:  None Available. FINDINGS: Brain: No evidence of acute infarction, hemorrhage, hydrocephalus, extra-axial collection or mass lesion/mass effect. Advanced atrophy. Moderate periventricular and deep white matter hypodensity typical of chronic small vessel ischemia. Vascular: Atherosclerosis of skullbase vasculature without hyperdense vessel or abnormal calcification. Skull: No fracture or focal lesion. Sinuses/Orbits: No acute  findings.  Bilateral ocular surgery. Other: No confluent scalp hematoma. IMPRESSION: 1. No acute intracranial abnormality. No skull fracture. 2. Advanced atrophy and chronic small vessel ischemia. Electronically Signed   By: Keith Rake M.D.   On: 02/19/2022 16:03   DG Chest 2 View  Result Date: 02/19/2022 CLINICAL DATA:  Fall.  Exertional shortness of breath. EXAM: CHEST - 2 VIEW COMPARISON:  AP chest 09/08/2021, chest two views 10/30/2008 FINDINGS: Cardiac silhouette and mediastinal contours are within normal limits. Moderate calcification within the aortic arch. Unchanged right lower hemithoraxz density, possibly chronic airspace scarring versus the anterior rib costochondral junction. No acute airspace opacity is seen to indicate pneumonia. No pleural effusion pneumothorax. Moderate multilevel degenerative disc  changes of the thoracic spine. IMPRESSION: No acute cardiopulmonary process. Electronically Signed   By: Yvonne Kendall M.D.   On: 02/19/2022 15:06    Procedures Procedures    Medications Ordered in ED Medications - No data to display  ED Course/ Medical Decision Making/ A&P                           Medical Decision Making This patient presents to the ED with chief complaint(s) of fall with pertinent past medical history of mild dementia, a fib not on AC, HTN which further complicates the presenting complaint. The complaint involves an extensive differential diagnosis and also carries with it a high risk of complications and morbidity.    The differential diagnosis includes ICH, mass effect, pneumothorax, pneumonia, pulmonary edema, pleural effusion, ACS, arrhythmia, anemia, CHF  Additional history obtained: Additional history obtained from family Records reviewed previous admission documents  ED Course and Reassessment: Patient was initially evaluated by provider in triage and had EKG, labs, head CT and chest x-ray performed.  The patient's workup included a negative head CT  and chest x-ray solids without acute disease without any signs of volume overload.  He tested negative for COVID and flu.  The patient is satting well on room air and in no acute respiratory distress.  He has no other signs of traumatic injury on exam and is at his neurologic baseline.  The patient is here with his granddaughter who is his POA at bedside who is comfortable with taking him home and states that he has outpatient follow-up with his primary doctor planned for further workup of his shortness of breath.  Patient is stable for discharge home and was given strict return precautions.  Independent labs interpretation:  The following labs were independently interpreted: Within normal range  Independent visualization of imaging: - I independently visualized the following imaging with scope of interpretation limited to determining acute life threatening conditions related to emergency care: CT head, chest x-ray, which revealed acute disease of the left  Consultation: - Consulted or discussed management/test interpretation w/ external professional: N/A  Consideration for admission or further workup: Patient has no emergent conditions requiring admission or further work-up at this time and is stable for discharge home with primary care follow-up  Social Determinants of health: N/A            Final Clinical Impression(s) / ED Diagnoses Final diagnoses:  Fall, initial encounter  Dyspnea on exertion    Rx / DC Orders ED Discharge Orders     None         Kemper Durie, DO 02/19/22 1925

## 2022-03-12 ENCOUNTER — Other Ambulatory Visit (HOSPITAL_COMMUNITY): Payer: Self-pay | Admitting: Family

## 2022-03-12 DIAGNOSIS — R600 Localized edema: Secondary | ICD-10-CM

## 2022-03-12 DIAGNOSIS — E785 Hyperlipidemia, unspecified: Secondary | ICD-10-CM

## 2022-03-12 DIAGNOSIS — R0602 Shortness of breath: Secondary | ICD-10-CM

## 2022-03-12 DIAGNOSIS — I480 Paroxysmal atrial fibrillation: Secondary | ICD-10-CM

## 2022-03-28 ENCOUNTER — Other Ambulatory Visit (HOSPITAL_BASED_OUTPATIENT_CLINIC_OR_DEPARTMENT_OTHER): Payer: Medicare Other

## 2022-03-28 ENCOUNTER — Ambulatory Visit (INDEPENDENT_AMBULATORY_CARE_PROVIDER_SITE_OTHER): Payer: Medicare Other

## 2022-03-28 DIAGNOSIS — I480 Paroxysmal atrial fibrillation: Secondary | ICD-10-CM | POA: Diagnosis not present

## 2022-03-28 DIAGNOSIS — E785 Hyperlipidemia, unspecified: Secondary | ICD-10-CM

## 2022-03-28 DIAGNOSIS — R0602 Shortness of breath: Secondary | ICD-10-CM | POA: Diagnosis not present

## 2022-03-28 DIAGNOSIS — R600 Localized edema: Secondary | ICD-10-CM | POA: Diagnosis not present

## 2022-03-29 LAB — ECHOCARDIOGRAM COMPLETE
AR max vel: 1.71 cm2
AV Area VTI: 1.56 cm2
AV Area mean vel: 1.6 cm2
AV Mean grad: 6.5 mmHg
AV Peak grad: 11.7 mmHg
Ao pk vel: 1.71 m/s
Area-P 1/2: 2.48 cm2
Calc EF: 71.8 %
MV M vel: 2.44 m/s
MV Peak grad: 23.8 mmHg
MV VTI: 1.52 cm2
S' Lateral: 2.24 cm
Single Plane A2C EF: 72.2 %
Single Plane A4C EF: 70.4 %

## 2023-12-18 ENCOUNTER — Emergency Department (HOSPITAL_COMMUNITY)

## 2023-12-18 ENCOUNTER — Encounter (HOSPITAL_COMMUNITY): Payer: Self-pay | Admitting: Emergency Medicine

## 2023-12-18 ENCOUNTER — Emergency Department (HOSPITAL_COMMUNITY)
Admission: EM | Admit: 2023-12-18 | Discharge: 2023-12-19 | Disposition: A | Discharge status: Home / Self Care | Attending: Emergency Medicine | Admitting: Emergency Medicine

## 2023-12-18 ENCOUNTER — Other Ambulatory Visit: Payer: Self-pay

## 2023-12-18 DIAGNOSIS — K029 Dental caries, unspecified: Secondary | ICD-10-CM

## 2023-12-18 DIAGNOSIS — Z79899 Other long term (current) drug therapy: Secondary | ICD-10-CM | POA: Insufficient documentation

## 2023-12-18 DIAGNOSIS — I10 Essential (primary) hypertension: Secondary | ICD-10-CM | POA: Insufficient documentation

## 2023-12-18 DIAGNOSIS — R059 Cough, unspecified: Secondary | ICD-10-CM | POA: Insufficient documentation

## 2023-12-18 DIAGNOSIS — R531 Weakness: Secondary | ICD-10-CM | POA: Insufficient documentation

## 2023-12-18 DIAGNOSIS — Z7982 Long term (current) use of aspirin: Secondary | ICD-10-CM | POA: Insufficient documentation

## 2023-12-18 DIAGNOSIS — D72829 Elevated white blood cell count, unspecified: Secondary | ICD-10-CM | POA: Insufficient documentation

## 2023-12-18 DIAGNOSIS — F0390 Unspecified dementia without behavioral disturbance: Secondary | ICD-10-CM | POA: Insufficient documentation

## 2023-12-18 LAB — RESP PANEL BY RT-PCR (RSV, FLU A&B, COVID)  RVPGX2
Influenza A by PCR: NEGATIVE
Influenza B by PCR: NEGATIVE
Resp Syncytial Virus by PCR: NEGATIVE
SARS Coronavirus 2 by RT PCR: NEGATIVE

## 2023-12-18 LAB — URINALYSIS, W/ REFLEX TO CULTURE (INFECTION SUSPECTED)
Bacteria, UA: NONE SEEN
Bilirubin Urine: NEGATIVE
Glucose, UA: NEGATIVE mg/dL
Hgb urine dipstick: NEGATIVE
Ketones, ur: NEGATIVE mg/dL
Leukocytes,Ua: NEGATIVE
Nitrite: NEGATIVE
Protein, ur: NEGATIVE mg/dL
Specific Gravity, Urine: 1.021 (ref 1.005–1.030)
pH: 5 (ref 5.0–8.0)

## 2023-12-18 LAB — CBC WITH DIFFERENTIAL/PLATELET
Abs Immature Granulocytes: 0.05 K/uL (ref 0.00–0.07)
Basophils Absolute: 0 K/uL (ref 0.0–0.1)
Basophils Relative: 0 %
Eosinophils Absolute: 0 K/uL (ref 0.0–0.5)
Eosinophils Relative: 0 %
HCT: 47.3 % (ref 39.0–52.0)
Hemoglobin: 15.6 g/dL (ref 13.0–17.0)
Immature Granulocytes: 0 %
Lymphocytes Relative: 11 %
Lymphs Abs: 1.5 K/uL (ref 0.7–4.0)
MCH: 30 pg (ref 26.0–34.0)
MCHC: 33 g/dL (ref 30.0–36.0)
MCV: 91 fL (ref 80.0–100.0)
Monocytes Absolute: 1 K/uL (ref 0.1–1.0)
Monocytes Relative: 7 %
Neutro Abs: 11.1 K/uL — ABNORMAL HIGH (ref 1.7–7.7)
Neutrophils Relative %: 82 %
Platelets: 188 K/uL (ref 150–400)
RBC: 5.2 MIL/uL (ref 4.22–5.81)
RDW: 12.8 % (ref 11.5–15.5)
WBC: 13.8 K/uL — ABNORMAL HIGH (ref 4.0–10.5)
nRBC: 0 % (ref 0.0–0.2)

## 2023-12-18 LAB — I-STAT CG4 LACTIC ACID, ED
Lactic Acid, Venous: 1.5 mmol/L (ref 0.5–1.9)
Lactic Acid, Venous: 1 mmol/L (ref 0.5–1.9)

## 2023-12-18 LAB — COMPREHENSIVE METABOLIC PANEL WITH GFR
ALT: 12 U/L (ref 0–44)
AST: 18 U/L (ref 15–41)
Albumin: 4.1 g/dL (ref 3.5–5.0)
Alkaline Phosphatase: 97 U/L (ref 38–126)
Anion gap: 11 (ref 5–15)
BUN: 19 mg/dL (ref 8–23)
CO2: 27 mmol/L (ref 22–32)
Calcium: 9.2 mg/dL (ref 8.9–10.3)
Chloride: 100 mmol/L (ref 98–111)
Creatinine, Ser: 1.18 mg/dL (ref 0.61–1.24)
GFR, Estimated: 56 mL/min — ABNORMAL LOW (ref >60)
Glucose, Bld: 143 mg/dL — ABNORMAL HIGH (ref 70–99)
Potassium: 4 mmol/L (ref 3.5–5.1)
Sodium: 138 mmol/L (ref 135–145)
Total Bilirubin: 1.7 mg/dL — ABNORMAL HIGH (ref 0.0–1.2)
Total Protein: 7 g/dL (ref 6.5–8.1)

## 2023-12-18 LAB — PROTIME-INR
INR: 1.1 (ref 0.8–1.2)
Prothrombin Time: 14.7 s (ref 11.4–15.2)

## 2023-12-18 MED ORDER — AMOXICILLIN 500 MG PO CAPS
500.0000 mg | ORAL_CAPSULE | Freq: Once | ORAL | Status: AC
  Administered 2023-12-18: 500 mg via ORAL
  Filled 2023-12-18: qty 1

## 2023-12-18 MED ORDER — LACTATED RINGERS IV BOLUS
500.0000 mL | Freq: Once | INTRAVENOUS | Status: AC
  Administered 2023-12-18: 500 mL via INTRAVENOUS

## 2023-12-18 MED ORDER — AMOXICILLIN 500 MG PO CAPS: 500.0000 mg | ORAL_CAPSULE | Freq: Three times a day (TID) | ORAL | 0 refills | Status: AC

## 2023-12-18 MED ORDER — LACTATED RINGERS IV BOLUS (SEPSIS): 1000.0000 mL | Freq: Once | INTRAVENOUS | Status: DC

## 2023-12-18 NOTE — ED Provider Notes (Addendum)
 [place] EMERGENCY DEPARTMENT AT [facility] Provider Note   CSN: [id number] Arrival date & time: 12/18/23  1657     Patient presents with: Weakness   Gary Santiago is a 89 y.o. male.    Weakness    Patient has history of atrial fibrillation hypertension BPH, dementia.  Patient resides at another facility and [facility].  Patient reportedly has not been eating or drinking the last couple days.  He was febrile.  Staff noted he seemed to be more weak and was having more difficulty walking.  He was reportedly having some cough.  He was sent to the ED for further evaluation.  Patient himself does not know why he is here.  He denies any complaints.  He denies having any headache or chest pain or abdominal pain.  He denies vomiting or diarrhea.  He denies feeling weak  Prior to Admission medications   Medication Sig Start Date End Date Taking? Authorizing Provider  amoxicillin (AMOXIL) 500 MG capsule Take 1 capsule (500 mg total) by mouth 3 (three) times daily. 12/18/23  Yes Randol Simmonds, MD  aspirin  81 MG tablet Take 81 mg by mouth daily.    [provider]  Cholecalciferol (VITAMIN D-3 PO) Take 1,000 Units by mouth daily.    [provider]  diltiazem  (CARDIZEM  CD) 120 MG 24 hr capsule Take 1 capsule (120 mg total) by mouth daily for 6 days. 09/12/21 09/18/21  Vann, Jessica U, DO  diltiazem  (CARDIZEM  CD) 240 MG 24 hr capsule Take 1 capsule (240 mg total) by mouth daily. 09/18/21   Vann, Jessica U, DO  hydrochlorothiazide  (HYDRODIURIL ) 12.5 MG tablet Take 12.5 mg by mouth daily. 08/29/21   [provider]  Multiple Vitamin (MULTIVITAMIN) tablet Take 1 tablet by mouth daily.    [provider]  simvastatin  (ZOCOR ) 20 MG tablet Take 1 tablet (20 mg total) by mouth daily. 09/18/21   Vann, Jessica U, DO  tamsulosin  (FLOMAX ) 0.4 MG CAPS capsule Take 1 capsule (0.4 mg total) by mouth at bedtime. 09/18/21   Vann, Jessica U, DO    Allergies: Patient  has no known allergies.    Review of Systems  Neurological:  Positive for weakness.    Updated Vital Signs BP (!) 126/57   Pulse (!) 58   Temp 98.7 F (37.1 C)   Resp 19   SpO2 95%   Physical Exam Vitals and nursing note reviewed.  Constitutional:      Appearance: He is well-developed. He is not diaphoretic.  HENT:     Head: Normocephalic and atraumatic.     Right Ear: External ear normal.     Left Ear: External ear normal.  Eyes:     General: No scleral icterus.       Right eye: No discharge.        Left eye: No discharge.     Conjunctiva/sclera: Conjunctivae normal.  Neck:     Trachea: No tracheal deviation.  Cardiovascular:     Rate and Rhythm: Normal rate and regular rhythm.  Pulmonary:     Effort: Pulmonary effort is normal. No respiratory distress.     Breath sounds: Normal breath sounds. No stridor. No wheezing or rales.  Abdominal:     General: Bowel sounds are normal. There is no distension.     Palpations: Abdomen is soft.     Tenderness: There is no abdominal tenderness. There is no guarding or rebound.  Musculoskeletal:  General: No tenderness or deformity.     Cervical back: Neck supple.  Skin:    General: Skin is warm and dry.     Findings: No rash.  Neurological:     General: No focal deficit present.     Mental Status: He is alert.     Cranial Nerves: No cranial nerve deficit, dysarthria or facial asymmetry.     Sensory: No sensory deficit.     Motor: No abnormal muscle tone or seizure activity.     Coordination: Coordination normal.     Comments: Equal grip strength bilaterally, able to lift both arms legs off the bed  Psychiatric:        Mood and Affect: Mood normal.     (all labs ordered are listed, but only abnormal results are displayed) Labs Reviewed  COMPREHENSIVE METABOLIC PANEL WITH GFR - Abnormal; Notable for the following components:      Result Value   Glucose, Bld 143 (*)    Total Bilirubin 1.7 (*)    GFR, Estimated 56  (*)    All other components within normal limits  CBC WITH DIFFERENTIAL/PLATELET - Abnormal; Notable for the following components:   WBC 13.8 (*)    Neutro Abs 11.1 (*)    All other components within normal limits  URINALYSIS, W/ REFLEX TO CULTURE (INFECTION SUSPECTED) - Abnormal; Notable for the following components:   APPearance HAZY (*)    All other components within normal limits  RESP PANEL BY RT-PCR (RSV, FLU A&B, COVID)  RVPGX2  CULTURE, BLOOD (ROUTINE X 2)  CULTURE, BLOOD (ROUTINE X 2)  PROTIME-INR  I-STAT CG4 LACTIC ACID, ED  I-STAT CG4 LACTIC ACID, ED    EKG: None  Radiology: DG Chest Port 1 View Result Date: 12/18/2023 CLINICAL DATA:  Questionable sepsis - evaluate for abnormality EXAM: PORTABLE CHEST - 1 VIEW COMPARISON:  02/19/2022 FINDINGS: No focal airspace consolidation, pleural effusion, or pneumothorax. Mild cardiomegaly. Aortic atherosclerosis. No acute fracture or destructive lesions. Multilevel thoracic osteophytosis. IMPRESSION: No acute cardiopulmonary abnormality. Electronically Signed   By: Rogelia Myers M.D.   On: 12/18/2023 17:51     Procedures   Medications Ordered in the ED  amoxicillin (AMOXIL) capsule 500 mg (has no administration in time range)  lactated ringers  bolus 500 mL (0 mLs Intravenous Stopped 12/18/23 1914)    Clinical Course as of 12/18/23 2116  Fri Dec 18, 2023  1911 CBC with Differential(!) CBC shows leukocytosis of 13.8.  Metabolic panel unremarkable.  COVID flu RSV negative [JK]  1912 Chest x-ray without acute abnormality.  Lactic acid level normal [JK]  2040 Urinalysis, w/ Reflex to Culture (Infection Suspected) -Urine, Clean Catch(!) Urinalysis without signs of infection. [JK]  2054 Contacted great niece Dr Gretta.  Reviewed workup today.  Would be comfortable going back to nursing facility.  I did examine patient's mouth.  There is question of dental caries in the right posterior upper molar.  Potentially could be a source of  infection. [JK]    Clinical Course User Index [JK] Randol Simmonds, MD                                 Medical Decision Making Amount and/or Complexity of Data Reviewed Labs: ordered. Decision-making details documented in ED Course. Radiology: ordered.  Risk Prescription drug management.  Patient presented to the ED for evaluation of possible infection.  Patient reportedly had decreased p.o. intake and fever  today.  In the ED patient is alert and awake and denies any complaints.  He is watching TV and does not appear to be in any distress.  ED workup did show slight leukocytosis but no signs of urinary tract infection.  No electrolyte abnormality.  No pneumonia.  Lactic acid levels normal going against evolving sepsis.  I discussed the case with the patient's great niece Dr. Gretta.  She reports he has been having some issues with dental infections in the past.  Patient denies any dental complaints however on exam he does have dental caries in the right posterior upper molar.  There may be some mild swelling.  He denies any pain or discomfort but potentially could be a source of infection.  Will start him on a course of antibiotics recommend outpatient follow-up with his dentist.  Patient is stable for discharge back to his nursing so that he    Final diagnoses:  Dental caries    ED Discharge Orders          Ordered    amoxicillin (AMOXIL) 500 MG capsule  3 times daily        12/18/23 2107               Randol Simmonds, MD 12/18/23 2050    Randol Simmonds, MD 12/18/23 2116

## 2023-12-18 NOTE — ED Triage Notes (Signed)
 Pt BIB EMS from [facility]. Has not been eating/drinking, febrile and not taking tylenol . Increased weakness.  Staff reports more unsteady gait.  Temp with EMS 100.  EMS gave 650 of Tylenol .  Family requested eval at hospital.  Cough present.

## 2023-12-18 NOTE — Discharge Instructions (Addendum)
 Take the antibiotics as prescribed.  Follow-up with your dentist to be rechecked.  It is possible you are developing a tooth infection in the upper right posterior molar

## 2023-12-19 NOTE — ED Notes (Signed)
 Called [facility] and informed them patient is on their way back home - spoke to staff Lade

## 2023-12-23 LAB — CULTURE, BLOOD (ROUTINE X 2)
Culture: NO GROWTH
Culture: NO GROWTH
